# Patient Record
Sex: Female | Born: 1940 | Race: White | Hispanic: No | Marital: Married | Smoking: Never smoker
Health system: Southern US, Community
[De-identification: ages and names within clinical notes are randomized; demographics above are authoritative.]

## PROBLEM LIST (undated history)

## (undated) HISTORY — PX: ABDOMINAL HYSTERECTOMY: SHX81

---

## 2014-01-08 ENCOUNTER — Ambulatory Visit: Payer: Self-pay | Admitting: Internal Medicine

## 2015-08-22 ENCOUNTER — Telehealth: Payer: Self-pay

## 2015-08-22 NOTE — Telephone Encounter (Signed)
When I called Keiyana's husband, Lyda JesterCurtis, the second time I was going to talk to her as well about her exam's. But there was no answer and no answer machine.

## 2017-04-28 ENCOUNTER — Ambulatory Visit
Admission: EM | Admit: 2017-04-28 | Discharge: 2017-04-28 | Disposition: A | Discharge status: Other Acute Inpatient Hospital | Payer: Medicare Other | Attending: Family Medicine | Admitting: Family Medicine

## 2017-04-28 ENCOUNTER — Encounter: Payer: Self-pay | Admitting: *Deleted

## 2017-04-28 DIAGNOSIS — R600 Localized edema: Secondary | ICD-10-CM | POA: Insufficient documentation

## 2017-04-28 DIAGNOSIS — R2242 Localized swelling, mass and lump, left lower limb: Secondary | ICD-10-CM | POA: Diagnosis not present

## 2017-04-28 DIAGNOSIS — I38 Endocarditis, valve unspecified: Secondary | ICD-10-CM | POA: Diagnosis not present

## 2017-04-28 DIAGNOSIS — I5021 Acute systolic (congestive) heart failure: Secondary | ICD-10-CM | POA: Diagnosis not present

## 2017-04-28 DIAGNOSIS — R0602 Shortness of breath: Secondary | ICD-10-CM | POA: Insufficient documentation

## 2017-04-28 DIAGNOSIS — R2241 Localized swelling, mass and lump, right lower limb: Secondary | ICD-10-CM

## 2017-04-28 DIAGNOSIS — R9431 Abnormal electrocardiogram [ECG] [EKG]: Secondary | ICD-10-CM

## 2017-04-28 DIAGNOSIS — R140 Abdominal distension (gaseous): Secondary | ICD-10-CM | POA: Insufficient documentation

## 2017-04-28 NOTE — ED Provider Notes (Signed)
[facility]    CSN: [id number] Arrival date & time: 04/28/17  1435     History   Chief Complaint Chief Complaint  Patient presents with  . Leg Swelling  . Shortness of Breath    HPI Toni Allen is a 76 y.o. female.   Patient is a 76 year old white female brought to the EDAccording to son-in-law and patient fully agrees initially to let her son-in-law and husband talk for her she has been having swelling of her legs for 6 weeks. According to them the shortness of breath is progressively gotten worse as well. Patient expresses concerned about the shortness of breath that she is having but she attributes that to the fact that her legs are markedly swollen and that because of her inability to move her swollen legs that is causing her shortness of breath. According to her husband she has not been to see a doctor for probably 20-25 years. He called last week to try to get her into see her PCP but because his been so long since she's been seen she was being treated as needed patient and the earliest appointment was made July. According to her son-in-law he's been trying to get her into see a doctor now for about 6 weeks. They report no medical problems for her she's been healthy no drug allergies is on no medication and she does not smoke and no one smokes around her. No known drug allergies she is never smoked. No pertinent family medical history relevant to today's visit. She does report increased shortness of breath when she exerts herself   The history is provided by the patient, the spouse and a relative. No language interpreter was used.  Shortness of Breath  Severity:  Severe Onset quality:  Gradual Progression:  Worsening Chronicity:  New Context: activity   Relieved by:  Nothing Worsened by:  Exertion Ineffective treatments:  None tried Associated symptoms: no chest pain   Risk factors: no tobacco use     History reviewed. No pertinent past medical  history.  There are no active problems to display for this patient.   History reviewed. No pertinent surgical history.  OB History    No data available       Home Medications    Prior to Admission medications   Not on File    Family History History reviewed. No pertinent family history.  Social History Social History  Substance Use Topics  . Smoking status: Never Smoker  . Smokeless tobacco: Never Used  . Alcohol use No     Allergies   Patient has no known allergies.   Review of Systems Review of Systems  Respiratory: Positive for shortness of breath.   Cardiovascular: Positive for leg swelling. Negative for chest pain.     Physical Exam Triage Vital Signs ED Triage Vitals  Enc Vitals Group     BP 04/28/17 1447 (!) 180/75     Pulse Rate 04/28/17 1447 92     Resp 04/28/17 1447 20     Temp 04/28/17 1447 98 F (36.7 C)     Temp Source 04/28/17 1447 Oral     SpO2 04/28/17 1447 100 %     Weight 04/28/17 1448 165 lb (74.8 kg)     Height 04/28/17 1448 5\' 5"  (1.651 m)     Head Circumference --      Peak Flow --      Pain Score 04/28/17 1448 0     Pain Loc --  Pain Edu? --      Excl. in GC? --    No data found.   Updated Vital Signs BP (!) 180/75 (BP Location: Right Arm)   Pulse 92   Temp 98 F (36.7 C) (Oral)   Resp 20   Ht 5\' 5"  (1.651 m)   Wt 165 lb (74.8 kg)   SpO2 100%   BMI 27.46 kg/m   Visual Acuity Right Eye Distance:   Left Eye Distance:   Bilateral Distance:    Right Eye Near:   Left Eye Near:    Bilateral Near:     Physical Exam  Constitutional: She appears well-developed and well-nourished. She appears ill.  HENT:  Head: Normocephalic and atraumatic.  Right Ear: Hearing, tympanic membrane, external ear and ear canal normal.  Left Ear: Hearing, tympanic membrane, external ear and ear canal normal.  Nose: Nose normal.  Eyes: Pupils are equal, round, and reactive to light.  Neck: Normal range of motion. Neck supple.  Normal carotid pulses and no hepatojugular reflux present. Carotid bruit is not present.  Cardiovascular:  Murmur heard.  Systolic murmur is present with a grade of 5/6  Patient has a hoarse a 5/6 holosystolic murmur makes one suspicious of valvular heart disease as well  Pulmonary/Chest: Effort normal. She has decreased breath sounds.  Abdominal: Soft. She exhibits no distension. There is no tenderness.  Musculoskeletal: Normal range of motion. She exhibits no edema.  Neurological: She is alert.  Skin: Skin is warm.  Psychiatric: Her mood appears anxious. Her affect is inappropriate. She expresses inappropriate judgment.  Patient is very upset at times appropriate with her refusal to go to the hospital. Initially was felt that she did not want to go to [facility] but now she informs her son-in-law and husband she wants to go to know hospital. Her son-in-law is going to call her son to come get her. She is also seemed to be very obsessed with her Bible having it daily but lotion on and that she is going to be mistreated in the hospital  Vitals reviewed.    UC Treatments / Results  Labs (all labs ordered are listed, but only abnormal results are displayed) Labs Reviewed - No data to display  EKG  EKG Interpretation None     ED ECG REPORT I, Blessing Ozga H, the attending physician, personally viewed and interpreted this ECG.   Date: 04/28/2017  EKG Time: 14:58:28  Rate: 94  Rhythm: there are no previous tracings available for comparison, normal sinus rhythm, Left atrial enlargement and T waves abnormalities may have anterior infarct with the small QRS complexes Q waves in 3  Axis:48  Intervals:none  ST&T Change: Nonspecific     Radiology No results found.  Procedures Procedures (including critical care time)  Medications Ordered in UC Medications - No data to display   Initial Impression / Assessment and Plan / UC Course  I have reviewed the triage vital signs and the  nursing notes.  Pertinent labs & imaging results that were available during my care of the patient were reviewed by me and considered in my medical decision making (see chart for details).   strongly feel the patient is in CHF. I'm going to recommend hospital referral. Patient was upset son-in-law finally calmed down only afford become upset again when she found out she was going to the hospital even going to [facility]. They're going to call or contact her son who works for EMS and [employer] to see  if he can talk her into coming going to the hospital. This point time she is yelling screaming and we'll try to aggravate her or agitated anymore. [facility]   Final Clinical Impressions(s) / UC Diagnoses   Final diagnoses:  Acute systolic congestive heart failure (HCC)  Valvular heart disease  Nonspecific abnormal electrocardiogram (ECG) (EKG)    New Prescriptions There are no discharge medications for this patient.   Note: This dictation was prepared with Dragon dictation along with smaller phrase technology. Any transcriptional errors that result from this process are unintentional.   Hassan RowanWade, Askari Kinley, MD 04/28/17 [phone]

## 2017-04-28 NOTE — ED Triage Notes (Signed)
Patient started having symptom of bilateral leg swelling that started 6 weeks ago. Lower legs are visible swollen. Symptom of SOB started more recently.

## 2017-05-12 ENCOUNTER — Ambulatory Visit (INDEPENDENT_AMBULATORY_CARE_PROVIDER_SITE_OTHER): Payer: Medicare Other | Admitting: Unknown Physician Specialty

## 2017-05-12 ENCOUNTER — Encounter: Payer: Self-pay | Admitting: Unknown Physician Specialty

## 2017-05-12 VITALS — BP 238/80 | HR 80 | Temp 98.5°F | Wt 126.4 lb

## 2017-05-12 DIAGNOSIS — Z7689 Persons encountering health services in other specified circumstances: Secondary | ICD-10-CM

## 2017-05-12 DIAGNOSIS — R748 Abnormal levels of other serum enzymes: Secondary | ICD-10-CM | POA: Insufficient documentation

## 2017-05-12 DIAGNOSIS — R140 Abdominal distension (gaseous): Secondary | ICD-10-CM | POA: Diagnosis not present

## 2017-05-12 DIAGNOSIS — R600 Localized edema: Secondary | ICD-10-CM

## 2017-05-12 DIAGNOSIS — R269 Unspecified abnormalities of gait and mobility: Secondary | ICD-10-CM | POA: Diagnosis not present

## 2017-05-12 DIAGNOSIS — R32 Unspecified urinary incontinence: Secondary | ICD-10-CM

## 2017-05-12 LAB — UA/M W/RFLX CULTURE, ROUTINE
BILIRUBIN UA: NEGATIVE
Glucose, UA: NEGATIVE
Leukocytes, UA: NEGATIVE
NITRITE UA: NEGATIVE
PH UA: 7 (ref 5.0–7.5)
Specific Gravity, UA: 1.015 (ref 1.005–1.030)
UUROB: 0.2 mg/dL (ref 0.2–1.0)

## 2017-05-12 LAB — MICROSCOPIC EXAMINATION
RBC MICROSCOPIC, UA: NONE SEEN /HPF (ref 0–?)
WBC UA: NONE SEEN /HPF (ref 0–?)

## 2017-05-12 NOTE — Assessment & Plan Note (Signed)
Discussed increased walking and compression stocking as needed.

## 2017-05-12 NOTE — Assessment & Plan Note (Signed)
Will need a GGT and fractinaed alk phos

## 2017-05-12 NOTE — Assessment & Plan Note (Addendum)
I'd like to refer to orthopedics or neurology.  Pt is upset with any mentions of medical appointments and husband asked me to "hold off" and any additional appointments at this time.  Refusing PT at this time

## 2017-05-12 NOTE — Assessment & Plan Note (Signed)
Distention is improved

## 2017-05-12 NOTE — Progress Notes (Signed)
BP (!) 238/80   Pulse 80   Temp 98.5 F (36.9 C)   Wt 126 lb 6.4 oz (57.3 kg)   SpO2 98%   BMI 21.03 kg/m    Subjective:    Patient ID: Toni Allen, female    DOB: 10/15/1941, 76 y.o.   MRN: [id number]  HPI: [name] is a 76 y.o. female  Chief Complaint  Patient presents with  . Establish Care    pt's husband states that the patient has been having a little bit of incontenience lately and would also like discuss her legs. States that the patient's legs and ankles have been swelling, states the patient needs compression socks    Pt is here with her husband who give part of the hisotry  Review of notes from [facility] urgent care and [facility] ER: Pt presented to [facility] urgent care 6/25 for complaints of progressive SOB and leg swelling.  She was then sent to [facility] ER for possible admission.  It was determined then she was not in CHF and likely had venous stasis as the cause for leg swelling.  Abdominal distention thought likely to constipation.    CTA was normal, EKG was normal, Chest x-ray was normal.  Labs were within normal limits but I do note an Alk phos of 247.    Her primary concern is her legs are swelling for about 2 months.  She brought some compression stockings OTC which help.  She states she stockings itch.   Fall Risk  05/12/2017  Falls in the past year? No   Depression screen PHQ 2/9 05/12/2017  Decreased Interest 0  Down, Depressed, Hopeless 0  PHQ - 2 Score 0  Altered sleeping 0  Tired, decreased energy 1  Change in appetite 0  Feeling bad or failure about yourself  0  Trouble concentrating 0  Moving slowly or fidgety/restless 0  Suicidal thoughts 0  PHQ-9 Score 1    Incontinence  Increased incontinence following hospitalization for several days.  This has improved but having urinary frequency.    Gait There is a problem with walking.  Husband states she used to have a normal gait until about 2 months ago.  No pain except with swelling.  She now walks  with difficulty  Relevant past medical, surgical, family and social history reviewed and updated as indicated. Interim medical history since our last visit reviewed. Allergies and medications reviewed and updated.  Review of Systems  Constitutional: Negative.   HENT: Negative.   Eyes: Negative.   Respiratory: Negative.   Cardiovascular: Positive for leg swelling.  Gastrointestinal: Positive for constipation.  Endocrine: Negative.   Genitourinary: Negative.   Musculoskeletal: Positive for gait problem.  Skin: Negative.   Allergic/Immunologic: Negative.   Hematological: Negative.   Psychiatric/Behavioral: Negative.     Per HPI unless specifically indicated above     Objective:    BP (!) 238/80   Pulse 80   Temp 98.5 F (36.9 C)   Wt 126 lb 6.4 oz (57.3 kg)   SpO2 98%   BMI 21.03 kg/m   Wt Readings from Last 3 Encounters:  05/12/17 126 lb 6.4 oz (57.3 kg)  04/28/17 165 lb (74.8 kg)    Note weight on 6/25 is an error Physical Exam  Constitutional: She is oriented to person, place, and time. She appears well-developed and well-nourished. No distress.  HENT:  Head: Normocephalic and atraumatic.  Eyes: Conjunctivae and lids are normal. Right eye exhibits no discharge. Left eye exhibits  no discharge. No scleral icterus.  Neck: Normal range of motion. Neck supple. No JVD present. Carotid bruit is not present.  Cardiovascular: Normal rate, regular rhythm and normal heart sounds.   Pulmonary/Chest: Effort normal and breath sounds normal.  Abdominal: Normal appearance. There is no splenomegaly or hepatomegaly.  Musculoskeletal: Normal range of motion.  Neurological: She is alert and oriented to person, place, and time.  Skin: Skin is warm, dry and intact. No rash noted. No pallor.  Psychiatric: She has a normal mood and affect. Her behavior is normal. Judgment and thought content normal.    Results for orders placed or performed in visit on 05/12/17  Microscopic Examination   Result Value Ref Range   WBC, UA None seen 0 - 5 /hpf   RBC, UA None seen 0 - 2 /hpf   Epithelial Cells (non renal) 0-10 0 - 10 /hpf   Mucus, UA Present (A) Not Estab.   Bacteria, UA Few (A) None seen/Few  UA/M w/rflx Culture, Routine  Result Value Ref Range   Specific Gravity, UA 1.015 1.005 - 1.030   pH, UA 7.0 5.0 - 7.5   Color, UA Yellow Yellow   Appearance Ur Clear Clear   Leukocytes, UA Negative Negative   Protein, UA Trace (A) Negative/Trace   Glucose, UA Negative Negative   Ketones, UA 2+ (A) Negative   RBC, UA Trace (A) Negative   Bilirubin, UA Negative Negative   Urobilinogen, Ur 0.2 0.2 - 1.0 mg/dL   Nitrite, UA Negative Negative   Microscopic Examination See below:       Assessment & Plan:   Problem List Items Addressed This Visit      Unprioritized   Abdominal distension    Distention is improved      Bilateral lower extremity edema    Discussed increased walking and compression stocking as needed.        Elevated alkaline phosphatase level    Will need a GGT and fractinaed alk phos      Relevant Orders   Gamma GT   Alkaline phosphatase   Gait abnormality    I'd like to refer to orthopedics or neurology.  Pt is upset with any mentions of medical appointments and husband asked me to "hold off" and any additional appointments at this time.  Refusing PT at this time       Other Visit Diagnoses    Urinary incontinence, unspecified type    -  Primary   Relevant Orders   UA/M w/rflx Culture, Routine (Completed)   Encounter to establish care           Follow up plan: Return in about 3 months (around 08/12/2017).

## 2017-05-13 LAB — ALKALINE PHOSPHATASE: ALK PHOS: 259 IU/L — AB (ref 39–117)

## 2017-05-13 LAB — GAMMA GT: GGT: 10 IU/L (ref 0–60)

## 2017-05-19 ENCOUNTER — Other Ambulatory Visit: Payer: Self-pay | Admitting: Unknown Physician Specialty

## 2017-05-19 ENCOUNTER — Telehealth: Payer: Self-pay | Admitting: Unknown Physician Specialty

## 2017-05-19 DIAGNOSIS — R748 Abnormal levels of other serum enzymes: Secondary | ICD-10-CM

## 2017-05-19 DIAGNOSIS — R824 Acetonuria: Secondary | ICD-10-CM | POA: Insufficient documentation

## 2017-05-19 NOTE — Telephone Encounter (Signed)
Discussed with husband to return for additional labs

## 2017-06-23 ENCOUNTER — Telehealth: Payer: Self-pay | Admitting: Unknown Physician Specialty

## 2017-06-23 NOTE — Telephone Encounter (Signed)
Discussed with husband about pt.  She will follow up September 9  and we will recheck her Alk phos.  Her ambulation is better.

## 2017-08-12 ENCOUNTER — Ambulatory Visit
Admission: RE | Admit: 2017-08-12 | Discharge: 2017-08-12 | Disposition: A | Discharge status: Home / Self Care | Payer: Medicare Other | Source: Ambulatory Visit | Attending: Unknown Physician Specialty | Admitting: Unknown Physician Specialty

## 2017-08-12 ENCOUNTER — Other Ambulatory Visit: Admission: RE | Admit: 2017-08-12 | Payer: Medicare Other | Source: Ambulatory Visit

## 2017-08-12 ENCOUNTER — Ambulatory Visit (INDEPENDENT_AMBULATORY_CARE_PROVIDER_SITE_OTHER): Payer: Medicare Other | Admitting: Unknown Physician Specialty

## 2017-08-12 ENCOUNTER — Encounter: Payer: Self-pay | Admitting: Unknown Physician Specialty

## 2017-08-12 ENCOUNTER — Other Ambulatory Visit: Payer: Self-pay | Admitting: Unknown Physician Specialty

## 2017-08-12 VITALS — BP 153/95 | HR 75 | Temp 98.4°F | Wt 137.8 lb

## 2017-08-12 DIAGNOSIS — R918 Other nonspecific abnormal finding of lung field: Secondary | ICD-10-CM | POA: Diagnosis not present

## 2017-08-12 DIAGNOSIS — R600 Localized edema: Secondary | ICD-10-CM | POA: Insufficient documentation

## 2017-08-12 DIAGNOSIS — R269 Unspecified abnormalities of gait and mobility: Secondary | ICD-10-CM | POA: Diagnosis not present

## 2017-08-12 DIAGNOSIS — S2241XA Multiple fractures of ribs, right side, initial encounter for closed fracture: Secondary | ICD-10-CM | POA: Insufficient documentation

## 2017-08-12 DIAGNOSIS — X58XXXA Exposure to other specified factors, initial encounter: Secondary | ICD-10-CM | POA: Insufficient documentation

## 2017-08-12 DIAGNOSIS — R5383 Other fatigue: Secondary | ICD-10-CM

## 2017-08-12 DIAGNOSIS — R824 Acetonuria: Secondary | ICD-10-CM

## 2017-08-12 DIAGNOSIS — R748 Abnormal levels of other serum enzymes: Secondary | ICD-10-CM | POA: Diagnosis not present

## 2017-08-12 LAB — UA/M W/RFLX CULTURE, ROUTINE
Bilirubin, UA: NEGATIVE
Glucose, UA: NEGATIVE
Ketones, UA: NEGATIVE
LEUKOCYTES UA: NEGATIVE
Nitrite, UA: NEGATIVE
PH UA: 5.5 (ref 5.0–7.5)
PROTEIN UA: NEGATIVE
Specific Gravity, UA: <1.005 — ABNORMAL LOW (ref 1.005–1.030)
Urobilinogen, Ur: 0.2 mg/dL (ref 0.2–1.0)

## 2017-08-12 MED ORDER — FUROSEMIDE 20 MG PO TABS: 20.0000 mg | ORAL_TABLET | Freq: Every day | ORAL | 3 refills | Status: DC

## 2017-08-12 MED ORDER — POTASSIUM CHLORIDE ER 10 MEQ PO TBCR: 10.0000 meq | EXTENDED_RELEASE_TABLET | Freq: Every day | ORAL | 1 refills | Status: DC

## 2017-08-12 NOTE — Assessment & Plan Note (Signed)
Discussion with patient and husband that liver source ruled out.  Will check bone source

## 2017-08-12 NOTE — Progress Notes (Signed)
BP (!) 153/95   Pulse 75   Temp 98.4 F (36.9 C)   Wt 137 lb 12.8 oz (62.5 kg)   BMI 22.93 kg/m    Subjective:    Patient ID: Toni Allen, female    DOB: 1940-11-24, 76 y.o.   MRN: [id number]  HPI: Toni Allen is a 76 y.o. female  Chief Complaint  Patient presents with  . Follow-up    3 month f/up   Pt comes in with husband who gives part of the history and she has noted is responsible for all her care. Pt it lost to f/u and  has a very high alk phos level noted on last visit.  She has been convinced to be seen due to persistent leg swelling.  Pt states it doesn't matter what she does, her legs stay swollen.  Using compression stockings help.  Also notes Ketosis from last visit.  She has gained 8 pounds from previous.  Struggling with ambulation but states it is due to le swelling.  Having fatigue in that she falls asleep easily.  No SOB.      Relevant past medical, surgical, family and social history reviewed and updated as indicated. Interim medical history since our last visit reviewed. Allergies and medications reviewed and updated.  Review of Systems  Per HPI unless specifically indicated above     Objective:    BP (!) 153/95   Pulse 75   Temp 98.4 F (36.9 C)   Wt 137 lb 12.8 oz (62.5 kg)   BMI 22.93 kg/m   Wt Readings from Last 3 Encounters:  08/12/17 137 lb 12.8 oz (62.5 kg)  05/12/17 126 lb 6.4 oz (57.3 kg)  04/28/17 165 lb (74.8 kg)    Physical Exam  Constitutional: She is oriented to person, place, and time. She appears well-developed and well-nourished. No distress.  HENT:  Head: Normocephalic and atraumatic.  Eyes: Conjunctivae and lids are normal. Right eye exhibits no discharge. Left eye exhibits no discharge. No scleral icterus.  Neck: Normal range of motion. Neck supple. No JVD present. Carotid bruit is not present.  Cardiovascular: Normal rate, regular rhythm and normal heart sounds.   Pulmonary/Chest: Effort normal and breath sounds  normal.  Abdominal: Normal appearance. There is no splenomegaly or hepatomegaly.  Musculoskeletal:  Bilateral leg edema  Neurological: She is alert and oriented to person, place, and time.  Skin: Skin is warm, dry and intact. No rash noted. No pallor.  Psychiatric: She has a normal mood and affect. Her behavior is normal. Judgment and thought content normal.     Assessment & Plan:   Problem List Items Addressed This Visit      Unprioritized   Bilateral lower extremity edema - Primary   Relevant Medications   furosemide (LASIX) 20 MG tablet   Other Relevant Orders   DG Chest 2 View   DG HIPS BILAT WITH PELVIS 3-4 VIEWS   Comprehensive metabolic panel   Elevated alkaline phosphatase level    Discussion with patient and husband that liver source ruled out.  Will check bone source      Relevant Orders   Comprehensive metabolic panel   Gait abnormality    Suspect related to her elevated Alk phos.  Check pelvis x-ray      Urine ketones    Recheck urine today       Other Visit Diagnoses    Other fatigue       Relevant Orders   CBC with Differential/Platelet  TSH       Follow up plan: Return in about 2 weeks (around 08/26/2017).

## 2017-08-12 NOTE — Assessment & Plan Note (Signed)
Suspect related to her elevated Alk phos.  Check pelvis x-ray

## 2017-08-12 NOTE — Assessment & Plan Note (Signed)
Recheck urine today  

## 2017-08-13 ENCOUNTER — Telehealth: Payer: Self-pay | Admitting: Unknown Physician Specialty

## 2017-08-13 ENCOUNTER — Other Ambulatory Visit: Payer: Self-pay | Admitting: Unknown Physician Specialty

## 2017-08-13 DIAGNOSIS — S2241XA Multiple fractures of ribs, right side, initial encounter for closed fracture: Secondary | ICD-10-CM

## 2017-08-13 DIAGNOSIS — R7989 Other specified abnormal findings of blood chemistry: Secondary | ICD-10-CM

## 2017-08-13 DIAGNOSIS — S2239XA Fracture of one rib, unspecified side, initial encounter for closed fracture: Secondary | ICD-10-CM | POA: Insufficient documentation

## 2017-08-13 DIAGNOSIS — E2839 Other primary ovarian failure: Secondary | ICD-10-CM

## 2017-08-13 DIAGNOSIS — E559 Vitamin D deficiency, unspecified: Secondary | ICD-10-CM | POA: Insufficient documentation

## 2017-08-13 MED ORDER — VITAMIN D (ERGOCALCIFEROL) 1.25 MG (50000 UNIT) PO CAPS: 50000.0000 [IU] | ORAL_CAPSULE | ORAL | 0 refills | Status: DC

## 2017-08-13 NOTE — Telephone Encounter (Signed)
Discussed rib fractures with husband.  He has no recollection of any traumatic events.  Pt not complaining of pain.  Very high PTH.  Normal Calcium.   Discussed with Dr. Dossie Arbour. Will refer to Endocrinologist

## 2017-08-13 NOTE — Progress Notes (Signed)
Scheduled for Tuesday November 6th at 10:00 AM in [place] per patient preference. Husband notified as he is in the office.

## 2017-08-14 LAB — PTH, INTACT AND CALCIUM: PTH: 296 pg/mL — AB (ref 15–65)

## 2017-08-14 LAB — CBC WITH DIFFERENTIAL/PLATELET
BASOS ABS: 0 10*3/uL (ref 0.0–0.2)
Basos: 1 %
EOS (ABSOLUTE): 0.3 10*3/uL (ref 0.0–0.4)
Eos: 6 %
Hematocrit: 39.8 % (ref 34.0–46.6)
Hemoglobin: 13.5 g/dL (ref 11.1–15.9)
IMMATURE GRANULOCYTES: 0 %
Immature Grans (Abs): 0 10*3/uL (ref 0.0–0.1)
LYMPHS ABS: 1.5 10*3/uL (ref 0.7–3.1)
Lymphs: 27 %
MCH: 31.2 pg (ref 26.6–33.0)
MCHC: 33.9 g/dL (ref 31.5–35.7)
MCV: 92 fL (ref 79–97)
MONOCYTES: 7 %
MONOS ABS: 0.4 10*3/uL (ref 0.1–0.9)
NEUTROS ABS: 3.3 10*3/uL (ref 1.4–7.0)
Neutrophils: 59 %
PLATELETS: 225 10*3/uL (ref 150–379)
RBC: 4.33 x10E6/uL (ref 3.77–5.28)
RDW: 14.7 % (ref 12.3–15.4)
WBC: 5.6 10*3/uL (ref 3.4–10.8)

## 2017-08-14 LAB — COMPREHENSIVE METABOLIC PANEL
A/G RATIO: 1.8 (ref 1.2–2.2)
ALBUMIN: 4.3 g/dL (ref 3.5–4.8)
ALT: 14 IU/L (ref 0–32)
AST: 14 IU/L (ref 0–40)
Alkaline Phosphatase: 209 IU/L — ABNORMAL HIGH (ref 39–117)
BILIRUBIN TOTAL: 0.3 mg/dL (ref 0.0–1.2)
BUN / CREAT RATIO: 16 (ref 12–28)
BUN: 11 mg/dL (ref 8–27)
CHLORIDE: 108 mmol/L — AB (ref 96–106)
CO2: 19 mmol/L — ABNORMAL LOW (ref 20–29)
Calcium: 8.7 mg/dL (ref 8.7–10.3)
Creatinine, Ser: 0.68 mg/dL (ref 0.57–1.00)
GFR calc non Af Amer: 84 mL/min/{1.73_m2} (ref >59)
GFR, EST AFRICAN AMERICAN: 97 mL/min/{1.73_m2} (ref >59)
GLOBULIN, TOTAL: 2.4 g/dL (ref 1.5–4.5)
Glucose: 75 mg/dL (ref 65–99)
POTASSIUM: 4.4 mmol/L (ref 3.5–5.2)
SODIUM: 141 mmol/L (ref 134–144)
TOTAL PROTEIN: 6.7 g/dL (ref 6.0–8.5)

## 2017-08-14 LAB — AMYLASE: AMYLASE: 98 U/L (ref 31–124)

## 2017-08-14 LAB — ALKALINE PHOSPHATASE, ISOENZYMES
BONE FRACTION: 62 % (ref 14–68)
INTESTINAL FRAC.: 4 % (ref 0–18)
LIVER FRACTION: 34 % (ref 18–85)

## 2017-08-14 LAB — LIPASE: LIPASE: 42 U/L (ref 14–85)

## 2017-08-14 LAB — TSH: TSH: 7.02 u[IU]/mL — ABNORMAL HIGH (ref 0.450–4.500)

## 2017-08-14 LAB — VITAMIN D 25 HYDROXY (VIT D DEFICIENCY, FRACTURES): Vit D, 25-Hydroxy: <4 ng/mL — ABNORMAL LOW (ref 30.0–100.0)

## 2017-08-18 ENCOUNTER — Other Ambulatory Visit: Payer: Self-pay | Admitting: Unknown Physician Specialty

## 2017-08-18 DIAGNOSIS — S2241XA Multiple fractures of ribs, right side, initial encounter for closed fracture: Secondary | ICD-10-CM

## 2017-08-18 NOTE — Progress Notes (Signed)
Called and spoke to patient's husband. He states that he is not really concerned with this right now. He states that he is afraid that if we put to much on the patient right now that she will not participate. He states that they have a bone density scan schedule for 09/09/17 and that they are supposed to be going to Endocrinology as well. Patient's husband wants to postpone going to orthopedics for right now.

## 2017-08-18 NOTE — Progress Notes (Signed)
Tried calling patient/husband and did not get an answer. No VM came up either so I will try to call again later.

## 2017-08-21 ENCOUNTER — Telehealth: Payer: Self-pay | Admitting: Family Medicine

## 2017-08-21 NOTE — Telephone Encounter (Signed)
Labs faxed

## 2017-08-21 NOTE — Telephone Encounter (Signed)
Will forward to referral coordinator. Also, please check PCP. Appears to be a [facility].

## 2017-08-21 NOTE — Telephone Encounter (Signed)
[facility] requesting lab results (PTH and vitamin D) as well as metabolic panel for pt's apt.   Please Advise.  Thank you  Phone: [phone] Fax: [phone]

## 2017-08-29 ENCOUNTER — Encounter: Payer: Self-pay | Admitting: Family Medicine

## 2017-09-03 ENCOUNTER — Ambulatory Visit (INDEPENDENT_AMBULATORY_CARE_PROVIDER_SITE_OTHER): Payer: Medicare Other | Admitting: Unknown Physician Specialty

## 2017-09-03 ENCOUNTER — Encounter: Payer: Self-pay | Admitting: Unknown Physician Specialty

## 2017-09-03 VITALS — BP 161/88 | HR 75 | Temp 98.3°F | Wt 137.8 lb

## 2017-09-03 DIAGNOSIS — Z5181 Encounter for therapeutic drug level monitoring: Secondary | ICD-10-CM

## 2017-09-03 DIAGNOSIS — E559 Vitamin D deficiency, unspecified: Secondary | ICD-10-CM | POA: Diagnosis not present

## 2017-09-03 DIAGNOSIS — R748 Abnormal levels of other serum enzymes: Secondary | ICD-10-CM

## 2017-09-03 DIAGNOSIS — R600 Localized edema: Secondary | ICD-10-CM

## 2017-09-03 DIAGNOSIS — R7989 Other specified abnormal findings of blood chemistry: Secondary | ICD-10-CM

## 2017-09-03 DIAGNOSIS — S2241XA Multiple fractures of ribs, right side, initial encounter for closed fracture: Secondary | ICD-10-CM | POA: Diagnosis not present

## 2017-09-03 MED ORDER — FUROSEMIDE 20 MG PO TABS: 20.0000 mg | ORAL_TABLET | Freq: Every day | ORAL | 3 refills | Status: DC

## 2017-09-03 MED ORDER — POTASSIUM CHLORIDE ER 10 MEQ PO TBCR: 10.0000 meq | EXTENDED_RELEASE_TABLET | Freq: Every day | ORAL | 2 refills | Status: DC

## 2017-09-03 NOTE — Assessment & Plan Note (Signed)
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  Refusing bone scan.  Urgent referral needed to Endocrine

## 2017-09-03 NOTE — Assessment & Plan Note (Signed)
Severe.  Urgent need for Endocrinology

## 2017-09-03 NOTE — Assessment & Plan Note (Signed)
Improved with current medication.  Refusing additional scans

## 2017-09-03 NOTE — Assessment & Plan Note (Addendum)
Non-traumatic.  Refusing bone scan or Orthopedic referral

## 2017-09-03 NOTE — Progress Notes (Signed)
BP (!) 161/88 (BP Location: Left Arm, Cuff Size: Small)   Pulse 75   Temp 98.3 F (36.8 C)   Wt 137 lb 12.8 oz (62.5 kg)   SpO2 99%   BMI 22.93 kg/m    Subjective:    Patient ID: Toni Allen, female    DOB: 10-23-1941, 76 y.o.   MRN: [id number]  HPI: Toni Allen is a 76 y.o. female  Chief Complaint  Patient presents with  . Follow-up   Husband is here with pt who gives much of the feedback and the history.  Pt has many concerning issues that include extreme elevation of PTH, Alk phos, very low Vit  D, and non-traumatic rib fractures.  Refusing Vitamin D as she is refusing as she feels her Vitamin C is adequate.   Urgent need to see Endocrine.  On communication with Heme/onc. Bone scan suggested but refused by the pt at this time.    Leg swelling Responding well to Furosemide and potassium.    Hypertension BP is high here but checks religiously at home.  Typically 120's over 60's    Relevant past medical, surgical, family and social history reviewed and updated as indicated. Interim medical history since our last visit reviewed. Allergies and medications reviewed and updated.  Review of Systems  Per HPI unless specifically indicated above     Objective:    BP (!) 161/88 (BP Location: Left Arm, Cuff Size: Small)   Pulse 75   Temp 98.3 F (36.8 C)   Wt 137 lb 12.8 oz (62.5 kg)   SpO2 99%   BMI 22.93 kg/m   Wt Readings from Last 3 Encounters:  09/03/17 137 lb 12.8 oz (62.5 kg)  08/12/17 137 lb 12.8 oz (62.5 kg)  05/12/17 126 lb 6.4 oz (57.3 kg)    Physical Exam  Constitutional: She is oriented to person, place, and time. She appears well-developed and well-nourished. No distress.  HENT:  Head: Normocephalic and atraumatic.  Eyes: Conjunctivae and lids are normal. Right eye exhibits no discharge. Left eye exhibits no discharge. No scleral icterus.  Neck: Normal range of motion. Neck supple. No JVD present. Carotid bruit is not present.    Cardiovascular: Normal rate, regular rhythm and normal heart sounds.   Pulmonary/Chest: Effort normal and breath sounds normal.  Abdominal: Normal appearance. There is no splenomegaly or hepatomegaly.  Musculoskeletal: Normal range of motion.  Neurological: She is alert and oriented to person, place, and time.  Skin: Skin is warm, dry and intact. No rash noted. No pallor.  Psychiatric: She has a normal mood and affect. Her behavior is normal. Judgment and thought content normal.    Results for orders placed or performed in visit on 08/12/17  Alkaline Phosphatase, Isoenzymes  Result Value Ref Range   LIVER FRACTION 34 18 - 85 %   BONE FRACTION 62 14 - 68 %   INTESTINAL FRAC. 4 0 - 18 %  Amylase  Result Value Ref Range   Amylase 98 31 - 124 U/L  Lipase  Result Value Ref Range   Lipase 42 14 - 85 U/L  UA/M w/rflx Culture, Routine  Result Value Ref Range   Specific Gravity, UA <1.005 (L) 1.005 - 1.030   pH, UA 5.5 5.0 - 7.5   Color, UA Yellow Yellow   Appearance Ur Clear Clear   Leukocytes, UA Negative Negative   Protein, UA Negative Negative/Trace   Glucose, UA Negative Negative   Ketones, UA Negative Negative   RBC,  UA Trace (A) Negative   Bilirubin, UA Negative Negative   Urobilinogen, Ur 0.2 0.2 - 1.0 mg/dL   Nitrite, UA Negative Negative  PTH, Intact and Calcium  Result Value Ref Range   PTH 296 (H) 15 - 65 pg/mL   PTH Interp Comment   Vitamin D (25 hydroxy)  Result Value Ref Range   Vit D, 25-Hydroxy <4.0 (L) 30.0 - 100.0 ng/mL  Comprehensive metabolic panel  Result Value Ref Range   Glucose 75 65 - 99 mg/dL   BUN 11 8 - 27 mg/dL   Creatinine, Ser 0.68 0.57 - 1.00 mg/dL   GFR calc non Af Amer 84 >59 mL/min/1.73   GFR calc Af Amer 97 >59 mL/min/1.73   BUN/Creatinine Ratio 16 12 - 28   Sodium 141 134 - 144 mmol/L   Potassium 4.4 3.5 - 5.2 mmol/L   Chloride 108 (H) 96 - 106 mmol/L   CO2 19 (L) 20 - 29 mmol/L   Calcium 8.7 8.7 - 10.3 mg/dL   Total Protein 6.7  6.0 - 8.5 g/dL   Albumin 4.3 3.5 - 4.8 g/dL   Globulin, Total 2.4 1.5 - 4.5 g/dL   Albumin/Globulin Ratio 1.8 1.2 - 2.2   Bilirubin Total 0.3 0.0 - 1.2 mg/dL   Alkaline Phosphatase 209 (H) 39 - 117 IU/L   AST 14 0 - 40 IU/L   ALT 14 0 - 32 IU/L  CBC with Differential/Platelet  Result Value Ref Range   WBC 5.6 3.4 - 10.8 x10E3/uL   RBC 4.33 3.77 - 5.28 x10E6/uL   Hemoglobin 13.5 11.1 - 15.9 g/dL   Hematocrit 39.8 34.0 - 46.6 %   MCV 92 79 - 97 fL   MCH 31.2 26.6 - 33.0 pg   MCHC 33.9 31.5 - 35.7 g/dL   RDW 14.7 12.3 - 15.4 %   Platelets 225 150 - 379 x10E3/uL   Neutrophils 59 Not Estab. %   Lymphs 27 Not Estab. %   Monocytes 7 Not Estab. %   Eos 6 Not Estab. %   Basos 1 Not Estab. %   Neutrophils Absolute 3.3 1.4 - 7.0 x10E3/uL   Lymphocytes Absolute 1.5 0.7 - 3.1 x10E3/uL   Monocytes Absolute 0.4 0.1 - 0.9 x10E3/uL   EOS (ABSOLUTE) 0.3 0.0 - 0.4 x10E3/uL   Basophils Absolute 0.0 0.0 - 0.2 x10E3/uL   Immature Granulocytes 0 Not Estab. %   Immature Grans (Abs) 0.0 0.0 - 0.1 x10E3/uL  TSH  Result Value Ref Range   TSH 7.020 (H) 0.450 - 4.500 uIU/mL      Assessment & Plan:   Problem List Items Addressed This Visit      Unprioritized   Bilateral lower extremity edema    Improved with current medication.  Refusing additional scans      Relevant Medications   furosemide (LASIX) 20 MG tablet   Elevated alkaline phosphatase level    Fractionation shows from bone.  Refusing bone scan.  Urgent referral needed to Endocrine      Elevated PTHrP level    Severe.  Urgent need for Endocrinology      Rib fracture    Non-traumatic.  Refusing bone scan or Orthopedic referral      Vitamin D deficiency    Severe, refusing Vitamin D supplementation.         Other Visit Diagnoses    Medication monitoring encounter    -  Primary   Relevant Orders   Comprehensive metabolic panel  Appt with Endocrine in December.  Will ask for an earlier visit.  Patient is refusing  additional testing till then.  OK with a CMP.  Risks of missing an underlying malignancy understood.    Follow up plan: Return in about 3 months (around 12/04/2017).

## 2017-09-03 NOTE — Assessment & Plan Note (Signed)
Severe, refusing Vitamin D supplementation.

## 2017-09-04 LAB — COMPREHENSIVE METABOLIC PANEL
A/G RATIO: 1.9 (ref 1.2–2.2)
ALBUMIN: 4.3 g/dL (ref 3.5–4.8)
ALT: 15 IU/L (ref 0–32)
AST: 18 IU/L (ref 0–40)
Alkaline Phosphatase: 204 IU/L — ABNORMAL HIGH (ref 39–117)
BILIRUBIN TOTAL: 0.3 mg/dL (ref 0.0–1.2)
BUN / CREAT RATIO: 15 (ref 12–28)
BUN: 11 mg/dL (ref 8–27)
CHLORIDE: 106 mmol/L (ref 96–106)
CO2: 20 mmol/L (ref 20–29)
Calcium: 8.9 mg/dL (ref 8.7–10.3)
Creatinine, Ser: 0.72 mg/dL (ref 0.57–1.00)
GFR calc Af Amer: 93 mL/min/{1.73_m2} (ref >59)
GFR calc non Af Amer: 80 mL/min/{1.73_m2} (ref >59)
GLOBULIN, TOTAL: 2.3 g/dL (ref 1.5–4.5)
GLUCOSE: 86 mg/dL (ref 65–99)
POTASSIUM: 4.2 mmol/L (ref 3.5–5.2)
SODIUM: 142 mmol/L (ref 134–144)
TOTAL PROTEIN: 6.6 g/dL (ref 6.0–8.5)

## 2017-09-09 ENCOUNTER — Inpatient Hospital Stay: Admission: RE | Admit: 2017-09-09 | Payer: Medicare Other | Source: Ambulatory Visit

## 2017-11-27 DIAGNOSIS — E213 Hyperparathyroidism, unspecified: Secondary | ICD-10-CM | POA: Insufficient documentation

## 2017-12-04 ENCOUNTER — Encounter: Payer: Self-pay | Admitting: Family Medicine

## 2017-12-04 ENCOUNTER — Ambulatory Visit (INDEPENDENT_AMBULATORY_CARE_PROVIDER_SITE_OTHER): Payer: Medicare Other | Admitting: Family Medicine

## 2017-12-04 VITALS — BP 162/93 | HR 70

## 2017-12-04 DIAGNOSIS — E559 Vitamin D deficiency, unspecified: Secondary | ICD-10-CM | POA: Diagnosis not present

## 2017-12-04 DIAGNOSIS — R748 Abnormal levels of other serum enzymes: Secondary | ICD-10-CM | POA: Diagnosis not present

## 2017-12-04 DIAGNOSIS — R600 Localized edema: Secondary | ICD-10-CM | POA: Diagnosis not present

## 2017-12-04 DIAGNOSIS — Z7189 Other specified counseling: Secondary | ICD-10-CM | POA: Insufficient documentation

## 2017-12-04 MED ORDER — FUROSEMIDE 20 MG PO TABS: 20.0000 mg | ORAL_TABLET | Freq: Every day | ORAL | 6 refills | Status: DC

## 2017-12-04 MED ORDER — POTASSIUM CHLORIDE ER 10 MEQ PO TBCR: 10.0000 meq | EXTENDED_RELEASE_TABLET | Freq: Every day | ORAL | 6 refills | Status: DC

## 2017-12-04 NOTE — Assessment & Plan Note (Signed)
We will follow with labs next visit

## 2017-12-04 NOTE — Assessment & Plan Note (Signed)
A voluntary discussion about advance care planning including the explanation and discussion of advance directives was extensively discussed  with the patient.  Explanation about the health care proxy and Living will was reviewed and packet with forms with explanation of how to fill them out was given.   Time spent: 16+ min       Individuals present: pt and husband.

## 2017-12-04 NOTE — Assessment & Plan Note (Signed)
Reviewed and help reinforce patient linked vitamin C absorption with taking her vitamin D.  May be this will help with compliance. Husband reaffirms the patient is taking her vitamin D now.

## 2017-12-04 NOTE — Progress Notes (Addendum)
BP (!) 162/93   Pulse 70   SpO2 98%    Subjective:    Patient ID: Toni Allen, female    DOB: September 21, 1941, 77 y.o.   MRN: [id number]  HPI: Toni Allen is a 77 y.o. female  Chief Complaint  Patient presents with  . Follow-up  Patient follow-up accompanied by her husband who assist primarily with history. Patient is taking her vitamin D now still is focus completely on vitamin C. Patient using a walker without problems 4 lower extremity edema patient doing somewhat better wearing support hose taking Lasix 20 mg along with potassium. Reviewed endocrinology notes.  Relevant past medical, surgical, family and social history reviewed and updated as indicated. Interim medical history since our last visit reviewed. Allergies and medications reviewed and updated.  Review of Systems  Constitutional: Negative.   Respiratory: Negative.   Cardiovascular: Negative.     Per HPI unless specifically indicated above     Objective:    BP (!) 162/93   Pulse 70   SpO2 98%   Wt Readings from Last 3 Encounters:  09/03/17 137 lb 12.8 oz (62.5 kg)  08/12/17 137 lb 12.8 oz (62.5 kg)  05/12/17 126 lb 6.4 oz (57.3 kg)    Physical Exam  Constitutional: She is oriented to person, place, and time. She appears well-developed and well-nourished.  HENT:  Head: Normocephalic and atraumatic.  Eyes: Conjunctivae and EOM are normal.  Neck: Normal range of motion.  Cardiovascular: Normal rate, regular rhythm and normal heart sounds.  Pulmonary/Chest: Effort normal and breath sounds normal.  Musculoskeletal: Normal range of motion.  Neurological: She is alert and oriented to person, place, and time.  Skin: No erythema.  Psychiatric: She has a normal mood and affect. Her behavior is normal. Judgment and thought content normal.    Results for orders placed or performed in visit on 09/03/17  Comprehensive metabolic panel  Result Value Ref Range   Glucose 86 65 - 99 mg/dL   BUN 11 8 - 27  mg/dL   Creatinine, Ser 4.090.72 0.57 - 1.00 mg/dL   GFR calc non Af Amer 80 >59 mL/min/1.73   GFR calc Af Amer 93 >59 mL/min/1.73   BUN/Creatinine Ratio 15 12 - 28   Sodium 142 134 - 144 mmol/L   Potassium 4.2 3.5 - 5.2 mmol/L   Chloride 106 96 - 106 mmol/L   CO2 20 20 - 29 mmol/L   Calcium 8.9 8.7 - 10.3 mg/dL   Total Protein 6.6 6.0 - 8.5 g/dL   Albumin 4.3 3.5 - 4.8 g/dL   Globulin, Total 2.3 1.5 - 4.5 g/dL   Albumin/Globulin Ratio 1.9 1.2 - 2.2   Bilirubin Total 0.3 0.0 - 1.2 mg/dL   Alkaline Phosphatase 204 (H) 39 - 117 IU/L   AST 18 0 - 40 IU/L   ALT 15 0 - 32 IU/L      Assessment & Plan:   Problem List Items Addressed This Visit      Other   Bilateral lower extremity edema    Stable with Lasix and compression      Relevant Medications   furosemide (LASIX) 20 MG tablet   Elevated alkaline phosphatase level    We will follow with labs next visit      Vitamin D deficiency    Reviewed and help reinforce patient linked vitamin C absorption with taking her vitamin D.  May be this will help with compliance. Husband reaffirms the patient is taking her vitamin  D now.      Advanced care planning/counseling discussion - Primary    A voluntary discussion about advance care planning including the explanation and discussion of advance directives was extensively discussed  with the patient.  Explanation about the health care proxy and Living will was reviewed and packet with forms with explanation of how to fill them out was given.   Time spent: 16+ min       Individuals present: pt and husband.           Follow up plan: Return in about 4 months (around 04/03/2018), or if symptoms worsen or fail to improve, for  CMP, TSH.

## 2017-12-04 NOTE — Assessment & Plan Note (Signed)
Stable with Lasix and compression

## 2018-04-01 ENCOUNTER — Ambulatory Visit: Payer: Medicare Other | Admitting: Family Medicine

## 2018-04-01 ENCOUNTER — Ambulatory Visit: Payer: Self-pay

## 2018-04-22 ENCOUNTER — Ambulatory Visit (INDEPENDENT_AMBULATORY_CARE_PROVIDER_SITE_OTHER): Payer: Medicare Other | Admitting: Family Medicine

## 2018-04-22 ENCOUNTER — Encounter: Payer: Self-pay | Admitting: Family Medicine

## 2018-04-22 ENCOUNTER — Ambulatory Visit (INDEPENDENT_AMBULATORY_CARE_PROVIDER_SITE_OTHER): Payer: Medicare Other

## 2018-04-22 VITALS — BP 142/84 | HR 70 | Temp 98.5°F | Resp 16 | Ht 64.0 in | Wt 143.7 lb

## 2018-04-22 DIAGNOSIS — Z0000 Encounter for general adult medical examination without abnormal findings: Secondary | ICD-10-CM

## 2018-04-22 DIAGNOSIS — E039 Hypothyroidism, unspecified: Secondary | ICD-10-CM

## 2018-04-22 DIAGNOSIS — R7989 Other specified abnormal findings of blood chemistry: Secondary | ICD-10-CM | POA: Diagnosis not present

## 2018-04-22 DIAGNOSIS — R600 Localized edema: Secondary | ICD-10-CM

## 2018-04-22 DIAGNOSIS — Z5181 Encounter for therapeutic drug level monitoring: Secondary | ICD-10-CM | POA: Diagnosis not present

## 2018-04-22 DIAGNOSIS — Z9114 Patient's other noncompliance with medication regimen: Secondary | ICD-10-CM | POA: Diagnosis not present

## 2018-04-22 DIAGNOSIS — E559 Vitamin D deficiency, unspecified: Secondary | ICD-10-CM

## 2018-04-22 DIAGNOSIS — Z9111 Patient's noncompliance with dietary regimen: Secondary | ICD-10-CM

## 2018-04-22 DIAGNOSIS — Z91148 Patient's other noncompliance with medication regimen for other reason: Secondary | ICD-10-CM

## 2018-04-22 MED ORDER — POTASSIUM CHLORIDE ER 10 MEQ PO TBCR: 10.0000 meq | EXTENDED_RELEASE_TABLET | Freq: Every day | ORAL | 6 refills | Status: DC

## 2018-04-22 MED ORDER — FUROSEMIDE 20 MG PO TABS: 20.0000 mg | ORAL_TABLET | Freq: Every day | ORAL | 6 refills | Status: DC

## 2018-04-22 NOTE — Patient Instructions (Addendum)
Toni Allen , Thank you for taking time to come for your Medicare Wellness Visit. I appreciate your ongoing commitment to your health goals. Please review the following plan we discussed and let me know if I can assist you in the future.   Screening recommendations/referrals: Colonoscopy: no longer required Mammogram: no longer required Bone Density: ordered Please call [phone] to schedule.  Recommended yearly ophthalmology/optometry visit for glaucoma screening and checkup Recommended yearly dental visit for hygiene and checkup  Vaccinations: Influenza vaccine: due 07/2018 Pneumococcal vaccine: declined Tdap vaccine: declined Shingles vaccine: shingrix eligible, check with your insurance company for coverage information     Advanced directives: Please bring a copy of your health care power of attorney and living will to the office at your convenience.  Conditions/risks identified: Recommend drinking at least 6-8 glasses of water a day   Next appointment: Follow up in one year for your annual wellness exam    Preventive Care 65 Years and Older, Female Preventive care refers to lifestyle choices and visits with your health care provider that can promote health and wellness. What does preventive care include?  A yearly physical exam. This is also called an annual well check.  Dental exams once or twice a year.  Routine eye exams. Ask your health care provider how often you should have your eyes checked.  Personal lifestyle choices, including:  Daily care of your teeth and gums.  Regular physical activity.  Eating a healthy diet.  Avoiding tobacco and drug use.  Limiting alcohol use.  Practicing safe sex.  Taking low-dose aspirin every day.  Taking vitamin and mineral supplements as recommended by your health care provider. What happens during an annual well check? The services and screenings done by your health care provider during your annual well check will  depend on your age, overall health, lifestyle risk factors, and family history of disease. Counseling  Your health care provider may ask you questions about your:  Alcohol use.  Tobacco use.  Drug use.  Emotional well-being.  Home and relationship well-being.  Sexual activity.  Eating habits.  History of falls.  Memory and ability to understand (cognition).  Work and work Astronomerenvironment.  Reproductive health. Screening  You may have the following tests or measurements:  Height, weight, and BMI.  Blood pressure.  Lipid and cholesterol levels. These may be checked every 5 years, or more frequently if you are over 79 years old.  Skin check.  Lung cancer screening. You may have this screening every year starting at age 79 if you have a 30-pack-year history of smoking and currently smoke or have quit within the past 15 years.  Fecal occult blood test (FOBT) of the stool. You may have this test every year starting at age 79.  Flexible sigmoidoscopy or colonoscopy. You may have a sigmoidoscopy every 5 years or a colonoscopy every 10 years starting at age 79.  Hepatitis C blood test.  Hepatitis B blood test.  Sexually transmitted disease (STD) testing.  Diabetes screening. This is done by checking your blood sugar (glucose) after you have not eaten for a while (fasting). You may have this done every 1-3 years.  Bone density scan. This is done to screen for osteoporosis. You may have this done starting at age 765.  Mammogram. This may be done every 1-2 years. Talk to your health care provider about how often you should have regular mammograms. Talk with your health care provider about your test results, treatment options, and if necessary,  the need for more tests. Vaccines  Your health care provider may recommend certain vaccines, such as:  Influenza vaccine. This is recommended every year.  Tetanus, diphtheria, and acellular pertussis (Tdap, Td) vaccine. You may need a  Td booster every 10 years.  Zoster vaccine. You may need this after age 69.  Pneumococcal 13-valent conjugate (PCV13) vaccine. One dose is recommended after age 56.  Pneumococcal polysaccharide (PPSV23) vaccine. One dose is recommended after age 3. Talk to your health care provider about which screenings and vaccines you need and how often you need them. This information is not intended to replace advice given to you by your health care provider. Make sure you discuss any questions you have with your health care provider. Document Released: 11/17/2015 Document Revised: 07/10/2016 Document Reviewed: 08/22/2015 Elsevier Interactive Patient Education  2017 [employer] Prevention in the Home Falls can cause injuries. They can happen to people of all ages. There are many things you can do to make your home safe and to help prevent falls. What can I do on the outside of my home?  Regularly fix the edges of walkways and driveways and fix any cracks.  Remove anything that might make you trip as you walk through a door, such as a raised step or threshold.  Trim any bushes or trees on the path to your home.  Use bright outdoor lighting.  Clear any walking paths of anything that might make someone trip, such as rocks or tools.  Regularly check to see if handrails are loose or broken. Make sure that both sides of any steps have handrails.  Any raised decks and porches should have guardrails on the edges.  Have any leaves, snow, or ice cleared regularly.  Use sand or salt on walking paths during winter.  Clean up any spills in your garage right away. This includes oil or grease spills. What can I do in the bathroom?  Use night lights.  Install grab bars by the toilet and in the tub and shower. Do not use towel bars as grab bars.  Use non-skid mats or decals in the tub or shower.  If you need to sit down in the shower, use a plastic, non-slip stool.  Keep the floor dry. Clean  up any water that spills on the floor as soon as it happens.  Remove soap buildup in the tub or shower regularly.  Attach bath mats securely with double-sided non-slip rug tape.  Do not have throw rugs and other things on the floor that can make you trip. What can I do in the bedroom?  Use night lights.  Make sure that you have a light by your bed that is easy to reach.  Do not use any sheets or blankets that are too big for your bed. They should not hang down onto the floor.  Have a firm chair that has side arms. You can use this for support while you get dressed.  Do not have throw rugs and other things on the floor that can make you trip. What can I do in the kitchen?  Clean up any spills right away.  Avoid walking on wet floors.  Keep items that you use a lot in easy-to-reach places.  If you need to reach something above you, use a strong step stool that has a grab bar.  Keep electrical cords out of the way.  Do not use floor polish or wax that makes floors slippery. If you must use  wax, use non-skid floor wax.  Do not have throw rugs and other things on the floor that can make you trip. What can I do with my stairs?  Do not leave any items on the stairs.  Make sure that there are handrails on both sides of the stairs and use them. Fix handrails that are broken or loose. Make sure that handrails are as long as the stairways.  Check any carpeting to make sure that it is firmly attached to the stairs. Fix any carpet that is loose or worn.  Avoid having throw rugs at the top or bottom of the stairs. If you do have throw rugs, attach them to the floor with carpet tape.  Make sure that you have a light switch at the top of the stairs and the bottom of the stairs. If you do not have them, ask someone to add them for you. What else can I do to help prevent falls?  Wear shoes that:  Do not have high heels.  Have rubber bottoms.  Are comfortable and fit you well.  Are  closed at the toe. Do not wear sandals.  If you use a stepladder:  Make sure that it is fully opened. Do not climb a closed stepladder.  Make sure that both sides of the stepladder are locked into place.  Ask someone to hold it for you, if possible.  Clearly mark and make sure that you can see:  Any grab bars or handrails.  First and last steps.  Where the edge of each step is.  Use tools that help you move around (mobility aids) if they are needed. These include:  Canes.  Walkers.  Scooters.  Crutches.  Turn on the lights when you go into a dark area. Replace any light bulbs as soon as they burn out.  Set up your furniture so you have a clear path. Avoid moving your furniture around.  If any of your floors are uneven, fix them.  If there are any pets around you, be aware of where they are.  Review your medicines with your doctor. Some medicines can make you feel dizzy. This can increase your chance of falling. Ask your doctor what other  things that you can do to help prevent falls. This information is not intended to replace advice given to you by your health care provider. Make sure you discuss any questions you have with your health care provider. Document Released: 08/17/2009 Document Revised: 03/28/2016 Document Reviewed: 11/25/2014 Elsevier Interactive Patient Education  2017 [employer].

## 2018-04-22 NOTE — Addendum Note (Signed)
Addended by: Marin RobertsHILL, Tavio Biegel A on: 04/22/2018 08:28 AM   Modules accepted: Orders

## 2018-04-22 NOTE — Assessment & Plan Note (Signed)
Encourage compliance with medication consequences not complying primarily nursing home care.

## 2018-04-22 NOTE — Assessment & Plan Note (Signed)
Discussed will check blood work.

## 2018-04-22 NOTE — Assessment & Plan Note (Signed)
Patient remains adamant about not taking supplements.  Will check vitamin D levels

## 2018-04-22 NOTE — Progress Notes (Signed)
BP (!) 142/84   Pulse 70   Ht 5\' 4"  (1.626 m)   Wt 143 lb (64.9 kg)   BMI 24.55 kg/m    Subjective:    Patient ID: Toni Allen, female    DOB: 11-Oct-1941, 77 y.o.   MRN: [id number]  HPI: Toni Allen is a 77 y.o. female  Follow-up vitamin D deficiency hypothyroid.  Patient accompanied by her husband who provides most of the history. In brief discussion with patient patient still remains noncompliant for vitamin D therapy or further evaluation.  Reviewed endocrinology's note from [facility].  Has been trying to get in vitamin D food supplementation sometimes getting ready and sometimes not. Patient remains very confused about food supplements vitamins.  Relevant past medical, surgical, family and social history reviewed and updated as indicated. Interim medical history since our last visit reviewed. Allergies and medications reviewed and updated.  Review of Systems  Constitutional: Negative.   Respiratory: Negative.   Cardiovascular: Negative.     Per HPI unless specifically indicated above     Objective:    BP (!) 142/84   Pulse 70   Ht 5\' 4"  (1.626 m)   Wt 143 lb (64.9 kg)   BMI 24.55 kg/m   Wt Readings from Last 3 Encounters:  04/22/18 143 lb (64.9 kg)  04/22/18 143 lb 11.2 oz (65.2 kg)  09/03/17 137 lb 12.8 oz (62.5 kg)    Physical Exam  Constitutional: She is oriented to person, place, and time. She appears well-developed and well-nourished.  HENT:  Head: Normocephalic and atraumatic.  Eyes: Conjunctivae and EOM are normal.  Neck: Normal range of motion.  Cardiovascular: Normal rate, regular rhythm and normal heart sounds.  Pulmonary/Chest: Effort normal and breath sounds normal.  Musculoskeletal: Normal range of motion.  Neurological: She is alert and oriented to person, place, and time.  Skin: No erythema.  Psychiatric: She has a normal mood and affect. Her behavior is normal. Judgment and thought content normal.    Results for orders placed or  performed in visit on 09/03/17  Comprehensive metabolic panel  Result Value Ref Range   Glucose 86 65 - 99 mg/dL   BUN 11 8 - 27 mg/dL   Creatinine, Ser 4.09 0.57 - 1.00 mg/dL   GFR calc non Af Amer 80 >59 mL/min/1.73   GFR calc Af Amer 93 >59 mL/min/1.73   BUN/Creatinine Ratio 15 12 - 28   Sodium 142 134 - 144 mmol/L   Potassium 4.2 3.5 - 5.2 mmol/L   Chloride 106 96 - 106 mmol/L   CO2 20 20 - 29 mmol/L   Calcium 8.9 8.7 - 10.3 mg/dL   Total Protein 6.6 6.0 - 8.5 g/dL   Albumin 4.3 3.5 - 4.8 g/dL   Globulin, Total 2.3 1.5 - 4.5 g/dL   Albumin/Globulin Ratio 1.9 1.2 - 2.2   Bilirubin Total 0.3 0.0 - 1.2 mg/dL   Alkaline Phosphatase 204 (H) 39 - 117 IU/L   AST 18 0 - 40 IU/L   ALT 15 0 - 32 IU/L      Assessment & Plan:   Problem List Items Addressed This Visit      Endocrine   Hypothyroid    Discussed will check blood work.      Relevant Orders   VITAMIN D 25 Hydroxy (Vit-D Deficiency, Fractures)     Other   Bilateral lower extremity edema   Relevant Medications   furosemide (LASIX) 20 MG tablet   Vitamin D deficiency  Patient remains adamant about not taking supplements.  Will check vitamin D levels      Noncompliance with diet and medication regimen    Encourage compliance with medication consequences not complying primarily nursing home care.          Follow up plan: Return in about 6 months (around 10/22/2018) for Physical Exam.

## 2018-04-22 NOTE — Progress Notes (Signed)
Subjective:   Toni Allen is Allen 77 y.o. female who presents for Medicare Annual (Subsequent) preventive examination.  Review of Systems:   Cardiac Risk Factors include: advanced age (>65men, >43 women)     Objective:     Vitals: BP (!) 142/84 (BP Location: Left Arm, Patient Position: Sitting)   Pulse 70   Temp 98.5 F (36.9 C) (Temporal)   Resp 16   Ht 5\' 4"  (1.626 m)   Wt 143 lb 11.2 oz (65.2 kg)   SpO2 98%   BMI 24.67 kg/m   Body mass index is 24.67 kg/m.  Advanced Directives 04/22/2018 04/28/2017  Does Patient Have Allen Medical Advance Directive? Yes No  Type of Estate agent of [place];Living will -  Copy of Healthcare Power of Attorney in Chart? No - copy requested -    Tobacco Social History   Tobacco Use  Smoking Status Never Smoker  Smokeless Tobacco Never Used     Counseling given: Not Answered   Clinical Intake:  Pre-visit preparation completed: Yes  Pain : No/denies pain     Nutritional Status: BMI of 19-24  Normal Nutritional Risks: None Diabetes: No  How often do you need to have someone help you when you read instructions, pamphlets, or other written materials from your doctor or pharmacy?: 1 - Never What is the last grade level you completed in school?: high school   Interpreter Needed?: No  Information entered by :: Toni Perl,LPN   History reviewed. No pertinent past medical history. Past Surgical History:  Procedure Laterality Date  . ABDOMINAL HYSTERECTOMY     partial   History reviewed. No pertinent family history. Social History   Socioeconomic History  . Marital status: Married    Spouse name: Not on file  . Number of children: Not on file  . Years of education: Not on file  . Highest education level: Not on file  Occupational History  . Not on file  Social Needs  . Financial resource strain: Not hard at all  . Food insecurity:    Worry: Never true    Inability: Never true  .  Transportation needs:    Medical: No    Non-medical: No  Tobacco Use  . Smoking status: Never Smoker  . Smokeless tobacco: Never Used  Substance and Sexual Activity  . Alcohol use: No  . Drug use: No  . Sexual activity: Not on file  Lifestyle  . Physical activity:    Days per week: 0 days    Minutes per session: 0 min  . Stress: Not at all  Relationships  . Social connections:    Talks on phone: More than three times Allen week    Gets together: More than three times Allen week    Attends religious service: Never    Active member of club or organization: No    Attends meetings of clubs or organizations: Never    Relationship status: Married  Other Topics Concern  . Not on file  Social History Narrative  . Not on file    Outpatient Encounter Medications as of 04/22/2018  Medication Sig  . furosemide (LASIX) 20 MG tablet Take 1 tablet (20 mg total) by mouth daily.  . potassium chloride (K-DUR) 10 MEQ tablet Take 1 tablet (10 mEq total) by mouth daily.  . vitamin C (ASCORBIC ACID) 500 MG tablet Take 1,500 mg by mouth daily.  . Vitamin D, Ergocalciferol, (DRISDOL) 50000 units CAPS capsule Take 1 capsule (50,000 Units  total) by mouth every 7 (seven) days. (Patient not taking: Reported on 09/03/2017)   No facility-administered encounter medications on file as of 04/22/2018.     Activities of Daily Living In your present state of health, do you have any difficulty performing the following activities: 04/22/2018 05/12/2017  Hearing? N N  Vision? N N  Difficulty concentrating or making decisions? Y N  Walking or climbing stairs? Y Y  Dressing or bathing? N N  Doing errands, shopping? Malvin JohnsY Y  Comment husband assists  husband takes the patient places  Quarry managerreparing Food and eating ? N -  Using the Toilet? N -  In the past six months, have you accidently leaked urine? Y -  Comment depends  -  Do you have problems with loss of bowel control? N -  Managing your Medications? N -  Managing your  Finances? N -  Housekeeping or managing your Housekeeping? N -  Some recent data might be hidden    Patient Care Team: Toni Sizerrissman, Toni A, MD as PCP - General (Family Medicine)    Assessment:   This is Allen routine wellness examination for Elease Hashimotoatricia.  Exercise Activities and Dietary recommendations Current Exercise Habits: Home exercise routine, Type of exercise: stretching, Time (Minutes): 60, Frequency (Times/Week): 7, Weekly Exercise (Minutes/Week): 420, Intensity: Mild, Exercise limited by: None identified  Goals    . DIET - INCREASE WATER INTAKE     Recommend drinking at least 6-8 glasses of water Allen day        Fall Risk Fall Risk  04/22/2018 12/04/2017 05/12/2017  Falls in the past year? No No No   Is the patient's home free of loose throw rugs in walkways, pet beds, electrical cords, etc?   yes      Grab bars in the bathroom? no      Handrails on the stairs?   no      Adequate lighting?   yes  Timed Get Up and Go performed: Completed in 10 seconds with use of assistive devices- walker, steady gait. No intervention needed at this time.   Depression Screen PHQ 2/9 Scores 04/22/2018 12/04/2017 05/12/2017  PHQ - 2 Score 0 0 0  PHQ- 9 Score - - 1     Cognitive Function- declined 6CIT screening          There is no immunization history on file for this patient.  Qualifies for Shingles Vaccine? Yes,discussed shingrix vaccine  Screening Tests Health Maintenance  Topic Date Due  . DEXA SCAN  03/17/2004  . TETANUS/TDAP  05/12/2018 (Originally 03/17/1958)  . PNA vac Low Risk Adult (1 of 2 - PCV13) 05/12/2018 (Originally 03/17/2004)  . INFLUENZA VACCINE  06/04/2018    Cancer Screenings: Lung: Low Dose CT Chest recommended if Age 72-80 years, 30 pack-year currently smoking OR have quit w/in 15years. Patient does not qualify. Breast:  Up to date on Mammogram? No  Longer required Up to date of Bone Density/Dexa? No, ordered Colorectal: no longer required  Additional Screenings:    Hepatitis C Screening: not indicated     Plan:    I have personally reviewed and addressed the Medicare Annual Wellness questionnaire and have noted the following in the patient's chart:  Allen. Medical and social history B. Use of alcohol, tobacco or illicit drugs  C. Current medications and supplements D. Functional ability and status E.  Nutritional status F.  Physical activity G. Advance directives H. List of other physicians I.  Hospitalizations, surgeries, and ER visits in  previous 12 months J.  Vitals K. Screenings such as hearing and vision if needed, cognitive and depression L. Referrals and appointments   In addition, I have reviewed and discussed with patient certain preventive protocols, quality metrics, and best practice recommendations. Allen written personalized care plan for preventive services as well as general preventive health recommendations were provided to patient.   Signed,  Marin Roberts, LPN Nurse Health Advisor   Nurse Notes:none

## 2018-04-23 LAB — COMPREHENSIVE METABOLIC PANEL
A/G RATIO: 1.9 (ref 1.2–2.2)
ALBUMIN: 4.3 g/dL (ref 3.5–4.8)
ALT: 17 IU/L (ref 0–32)
AST: 17 IU/L (ref 0–40)
Alkaline Phosphatase: 71 IU/L (ref 39–117)
BILIRUBIN TOTAL: 0.4 mg/dL (ref 0.0–1.2)
BUN / CREAT RATIO: 24 (ref 12–28)
BUN: 19 mg/dL (ref 8–27)
CHLORIDE: 103 mmol/L (ref 96–106)
CO2: 24 mmol/L (ref 20–29)
Calcium: 9.5 mg/dL (ref 8.7–10.3)
Creatinine, Ser: 0.8 mg/dL (ref 0.57–1.00)
GFR calc non Af Amer: 70 mL/min/{1.73_m2} (ref >59)
GFR, EST AFRICAN AMERICAN: 81 mL/min/{1.73_m2} (ref >59)
GLOBULIN, TOTAL: 2.3 g/dL (ref 1.5–4.5)
GLUCOSE: 84 mg/dL (ref 65–99)
Potassium: 3.9 mmol/L (ref 3.5–5.2)
SODIUM: 143 mmol/L (ref 134–144)
Total Protein: 6.6 g/dL (ref 6.0–8.5)

## 2018-04-23 LAB — TSH: TSH: 5.37 u[IU]/mL — ABNORMAL HIGH (ref 0.450–4.500)

## 2018-04-23 LAB — VITAMIN D 25 HYDROXY (VIT D DEFICIENCY, FRACTURES): Vit D, 25-Hydroxy: 57.5 ng/mL (ref 30.0–100.0)

## 2018-10-22 ENCOUNTER — Encounter: Payer: Self-pay | Admitting: Family Medicine

## 2018-10-22 ENCOUNTER — Ambulatory Visit: Payer: Medicare Other | Admitting: Family Medicine

## 2018-10-22 VITALS — BP 120/68 | HR 72 | Temp 98.1°F | Ht 58.58 in | Wt 150.4 lb

## 2018-10-22 DIAGNOSIS — Z91148 Patient's other noncompliance with medication regimen for other reason: Secondary | ICD-10-CM

## 2018-10-22 DIAGNOSIS — Z91119 Patient's noncompliance with dietary regimen due to unspecified reason: Secondary | ICD-10-CM

## 2018-10-22 DIAGNOSIS — E559 Vitamin D deficiency, unspecified: Secondary | ICD-10-CM | POA: Diagnosis not present

## 2018-10-22 DIAGNOSIS — I5021 Acute systolic (congestive) heart failure: Secondary | ICD-10-CM | POA: Diagnosis not present

## 2018-10-22 DIAGNOSIS — Z9114 Patient's other noncompliance with medication regimen: Secondary | ICD-10-CM

## 2018-10-22 DIAGNOSIS — Z9111 Patient's noncompliance with dietary regimen: Secondary | ICD-10-CM

## 2018-10-22 DIAGNOSIS — R600 Localized edema: Secondary | ICD-10-CM | POA: Diagnosis not present

## 2018-10-22 DIAGNOSIS — E039 Hypothyroidism, unspecified: Secondary | ICD-10-CM

## 2018-10-22 MED ORDER — FUROSEMIDE 20 MG PO TABS: 20.0000 mg | ORAL_TABLET | Freq: Every day | ORAL | 2 refills | Status: DC

## 2018-10-22 MED ORDER — POTASSIUM CHLORIDE ER 10 MEQ PO TBCR: 10.0000 meq | EXTENDED_RELEASE_TABLET | Freq: Every day | ORAL | 2 refills | Status: DC

## 2018-10-22 NOTE — Assessment & Plan Note (Signed)
The current medical regimen is effective;  continue present plan and medications.  

## 2018-10-22 NOTE — Assessment & Plan Note (Signed)
Doing better with has been working behind the scenes.

## 2018-10-22 NOTE — Progress Notes (Signed)
BP 120/68 (BP Location: Left Arm)   Pulse 72   Temp 98.1 F (36.7 C)   Ht 4' 10.58" (1.488 m)   Wt 150 lb 6 oz (68.2 kg)   SpO2 100%   BMI 30.81 kg/m    Subjective:    Patient ID: Toni Allen, female    DOB: October 04, 1941, 77 y.o.   MRN: [id number]  HPI: Toni Allen is a 77 y.o. female  Follow-up med check Patient with profound low vitamin D levels had been very resistant to taking medications but with husband's working behind the scenes patient has been able to take her vitamin D and last time her levels were back to normal and patient was doing better. Dementia is stable with no acting out behaviors. Heart failure is stable on potassium and Lasix low dose no real edema PND orthopnea changes.  Relevant past medical, surgical, family and social history reviewed and updated as indicated. Interim medical history since our last visit reviewed. Allergies and medications reviewed and updated.  Review of Systems  Constitutional: Negative.   Respiratory: Negative.   Cardiovascular: Negative.   History provided by husband as patient is unable to dementia  Per HPI unless specifically indicated above     Objective:    BP 120/68 (BP Location: Left Arm)   Pulse 72   Temp 98.1 F (36.7 C)   Ht 4' 10.58" (1.488 m)   Wt 150 lb 6 oz (68.2 kg)   SpO2 100%   BMI 30.81 kg/m   Wt Readings from Last 3 Encounters:  10/22/18 150 lb 6 oz (68.2 kg)  04/22/18 143 lb (64.9 kg)  04/22/18 143 lb 11.2 oz (65.2 kg)    Physical Exam Constitutional:      Appearance: She is well-developed.  HENT:     Head: Normocephalic and atraumatic.  Eyes:     Conjunctiva/sclera: Conjunctivae normal.  Neck:     Musculoskeletal: Normal range of motion.  Cardiovascular:     Rate and Rhythm: Normal rate and regular rhythm.     Heart sounds: Normal heart sounds.  Pulmonary:     Effort: Pulmonary effort is normal.     Breath sounds: Normal breath sounds.  Musculoskeletal: Normal range of motion.   Skin:    Findings: No erythema.  Neurological:     Mental Status: She is alert and oriented to person, place, and time.  Psychiatric:        Behavior: Behavior normal.        Thought Content: Thought content normal.        Judgment: Judgment normal.     Results for orders placed or performed in visit on 04/22/18  Comp Met (CMET)  Result Value Ref Range   Glucose 84 65 - 99 mg/dL   BUN 19 8 - 27 mg/dL   Creatinine, Ser 0.80 0.57 - 1.00 mg/dL   GFR calc non Af Amer 70 >59 mL/min/1.73   GFR calc Af Amer 81 >59 mL/min/1.73   BUN/Creatinine Ratio 24 12 - 28   Sodium 143 134 - 144 mmol/L   Potassium 3.9 3.5 - 5.2 mmol/L   Chloride 103 96 - 106 mmol/L   CO2 24 20 - 29 mmol/L   Calcium 9.5 8.7 - 10.3 mg/dL   Total Protein 6.6 6.0 - 8.5 g/dL   Albumin 4.3 3.5 - 4.8 g/dL   Globulin, Total 2.3 1.5 - 4.5 g/dL   Albumin/Globulin Ratio 1.9 1.2 - 2.2   Bilirubin Total 0.4 0.0 -  1.2 mg/dL   Alkaline Phosphatase 71 39 - 117 IU/L   AST 17 0 - 40 IU/L   ALT 17 0 - 32 IU/L  TSH  Result Value Ref Range   TSH 5.370 (H) 0.450 - 4.500 uIU/mL  VITAMIN D 25 Hydroxy (Vit-D Deficiency, Fractures)  Result Value Ref Range   Vit D, 25-Hydroxy 57.5 30.0 - 100.0 ng/mL      Assessment & Plan:   Problem List Items Addressed This Visit      Cardiovascular and Mediastinum   Acute systolic congestive heart failure (HCC)    stable      Relevant Medications   furosemide (LASIX) 20 MG tablet   Other Relevant Orders   Basic metabolic panel     Endocrine   Hypothyroid - Primary    The current medical regimen is effective;  continue present plan and medications.       Relevant Orders   TSH     Other   Bilateral lower extremity edema   Relevant Medications   furosemide (LASIX) 20 MG tablet   Other Relevant Orders   Basic metabolic panel   Vitamin D deficiency    The current medical regimen is effective;  continue present plan and medications.       Relevant Orders   VITAMIN D 25  Hydroxy (Vit-D Deficiency, Fractures)   Noncompliance with diet and medication regimen    Doing better with has been working behind the scenes.           Follow up plan: Return in about 6 months (around 04/23/2019) for Physical Exam.

## 2018-10-22 NOTE — Assessment & Plan Note (Signed)
stable °

## 2018-10-23 ENCOUNTER — Encounter: Payer: Self-pay | Admitting: Family Medicine

## 2018-10-23 LAB — BASIC METABOLIC PANEL
BUN / CREAT RATIO: 16 (ref 12–28)
BUN: 14 mg/dL (ref 8–27)
CALCIUM: 9.7 mg/dL (ref 8.7–10.3)
CHLORIDE: 102 mmol/L (ref 96–106)
CO2: 22 mmol/L (ref 20–29)
Creatinine, Ser: 0.9 mg/dL (ref 0.57–1.00)
GFR calc Af Amer: 70 mL/min/{1.73_m2} (ref >59)
GFR calc non Af Amer: 61 mL/min/{1.73_m2} (ref >59)
GLUCOSE: 83 mg/dL (ref 65–99)
POTASSIUM: 3.9 mmol/L (ref 3.5–5.2)
Sodium: 137 mmol/L (ref 134–144)

## 2018-10-23 LAB — VITAMIN D 25 HYDROXY (VIT D DEFICIENCY, FRACTURES): Vit D, 25-Hydroxy: 56.8 ng/mL (ref 30.0–100.0)

## 2018-10-23 LAB — TSH: TSH: 3.71 u[IU]/mL (ref 0.450–4.500)

## 2019-03-24 DIAGNOSIS — N179 Acute kidney failure, unspecified: Secondary | ICD-10-CM | POA: Insufficient documentation

## 2019-03-24 DIAGNOSIS — R410 Disorientation, unspecified: Secondary | ICD-10-CM | POA: Insufficient documentation

## 2019-03-24 DIAGNOSIS — R778 Other specified abnormalities of plasma proteins: Secondary | ICD-10-CM | POA: Insufficient documentation

## 2019-03-25 DIAGNOSIS — I82409 Acute embolism and thrombosis of unspecified deep veins of unspecified lower extremity: Secondary | ICD-10-CM | POA: Insufficient documentation

## 2019-03-26 MED ORDER — BACITRACIN-POLYMYXIN B 500-10000 UNIT/GM EX OINT
TOPICAL_OINTMENT | CUTANEOUS | Status: DC
Start: 2019-03-26 — End: 2019-03-26

## 2019-03-26 MED ORDER — VITAMIN B-12 1000 MCG PO TABS
1000.00 | ORAL_TABLET | ORAL | Status: DC
Start: 2019-03-27 — End: 2019-03-26

## 2019-03-26 MED ORDER — MEDI-TUSSIN DM DOUBLE STRENGTH 30-200 MG/5ML PO LIQD
1000.00 | ORAL | Status: DC
Start: ? — End: 2019-03-26

## 2019-03-26 MED ORDER — SENNOSIDES 8.6 MG PO TABS
2.00 | ORAL_TABLET | ORAL | Status: DC
Start: ? — End: 2019-03-26

## 2019-03-26 MED ORDER — POLYETHYLENE GLYCOL 3350 17 G PO PACK
17.00 | PACK | ORAL | Status: DC
Start: 2019-03-26 — End: 2019-03-26

## 2019-03-26 MED ORDER — Medication
Status: DC
Start: ? — End: 2019-03-26

## 2019-04-05 ENCOUNTER — Telehealth: Payer: Self-pay | Admitting: Family Medicine

## 2019-04-05 NOTE — Telephone Encounter (Signed)
Called pt about tomorrow's appt no answer, no voicemail

## 2019-04-06 ENCOUNTER — Ambulatory Visit (INDEPENDENT_AMBULATORY_CARE_PROVIDER_SITE_OTHER): Payer: Medicare Other | Admitting: Family Medicine

## 2019-04-06 ENCOUNTER — Other Ambulatory Visit: Payer: Self-pay

## 2019-04-06 ENCOUNTER — Encounter: Payer: Self-pay | Admitting: Family Medicine

## 2019-04-06 DIAGNOSIS — Z9111 Patient's noncompliance with dietary regimen: Secondary | ICD-10-CM | POA: Diagnosis not present

## 2019-04-06 DIAGNOSIS — Z91119 Patient's noncompliance with dietary regimen due to unspecified reason: Secondary | ICD-10-CM

## 2019-04-06 DIAGNOSIS — I5021 Acute systolic (congestive) heart failure: Secondary | ICD-10-CM

## 2019-04-06 DIAGNOSIS — Z9114 Patient's other noncompliance with medication regimen: Secondary | ICD-10-CM

## 2019-04-06 NOTE — Assessment & Plan Note (Signed)
The current medical regimen is effective;  continue present plan and medications.  

## 2019-04-06 NOTE — Assessment & Plan Note (Signed)
Reviewed family is doing better with food and water bringing in food and water and assessing daily

## 2019-04-06 NOTE — Progress Notes (Signed)
There were no vitals taken for this visit.   Subjective:    Patient ID: Toni Allen, female    DOB: August 11, 1941, 78 y.o.   MRN: [id number]  HPI: Toni Allen is a 78 y.o. female  No chief complaint on file.  Telemedicine using audio/video telecommunications for a synchronous communication visit. Today's visit due to COVID-19 isolation precautions I connected with and verified that I am speaking with the correct person using two identifiers.   I discussed the limitations, risks, security and privacy concerns of performing an evaluation and management service by telecommunication and the availability of in person appointments. I also discussed with the patient that there may be a patient responsible charge related to this service. The patient expressed understanding and agreed to proceed. The patient's location is home. I am at home.  Transition of Care Hospital Follow up.   Hospital/Facility:[facility] D/C Physician: Lodema Hong D/C Date: 03-26-19  Records Requested: na Records Received: na Records Reviewed: today  Diagnoses on Discharge: dementia  Date of interactive Contact within 48 hours of discharge: 03-29-19 Contact was through: phone  Date of 7 day or 14 day face-to-face visit:    04-06-19  Outpatient Encounter Medications as of 04/06/2019  Medication Sig  . furosemide (LASIX) 20 MG tablet Take 1 tablet (20 mg total) by mouth daily.  . potassium chloride (K-DUR) 10 MEQ tablet Take 1 tablet (10 mEq total) by mouth daily.  . vitamin C (ASCORBIC ACID) 500 MG tablet Take 1,500 mg by mouth daily.   No facility-administered encounter medications on file as of 04/06/2019.     Diagnostic Tests Reviewed/Disposition: done  Consults:na  Discharge Instructionsreviewed  Disease/illness Education:doen  Home Health/Community Services Discussions/Referrals:na  Establishment or re-establishment of referral orders for community resources:na  Discussion with other health care  providers:na  Assessment and Support of treatment regimen adherence:does  Appointments Coordinated with: na  Education for self-management, independent living, and ADLs: na  Relevant past medical, surgical, family and social history reviewed and updated as indicated. Interim medical history since our last visit reviewed. Allergies and medications reviewed and updated.  Review of Systems  Unable to perform ROS: Dementia    Per HPI unless specifically indicated above     Objective:    There were no vitals taken for this visit.  Wt Readings from Last 3 Encounters:  10/22/18 150 lb 6 oz (68.2 kg)  04/22/18 143 lb (64.9 kg)  04/22/18 143 lb 11.2 oz (65.2 kg)    Physical Exam  Results for orders placed or performed in visit on 10/22/18  Basic metabolic panel  Result Value Ref Range   Glucose 83 65 - 99 mg/dL   BUN 14 8 - 27 mg/dL   Creatinine, Ser 6.50 0.57 - 1.00 mg/dL   GFR calc non Af Amer 61 >59 mL/min/1.73   GFR calc Af Amer 70 >59 mL/min/1.73   BUN/Creatinine Ratio 16 12 - 28   Sodium 137 134 - 144 mmol/L   Potassium 3.9 3.5 - 5.2 mmol/L   Chloride 102 96 - 106 mmol/L   CO2 22 20 - 29 mmol/L   Calcium 9.7 8.7 - 10.3 mg/dL  TSH  Result Value Ref Range   TSH 3.710 0.450 - 4.500 uIU/mL  VITAMIN D 25 Hydroxy (Vit-D Deficiency, Fractures)  Result Value Ref Range   Vit D, 25-Hydroxy 56.8 30.0 - 100.0 ng/mL      Assessment & Plan:   Problem List Items Addressed This Visit      Cardiovascular  and Mediastinum   Acute systolic congestive heart failure (HCC)    The current medical regimen is effective;  continue present plan and medications.         Other   Noncompliance with diet and medication regimen    Reviewed family is doing better with food and water bringing in food and water and assessing daily          Follow up plan: Return if symptoms worsen or fail to improve, for As scheduled.

## 2019-05-04 ENCOUNTER — Other Ambulatory Visit: Payer: Self-pay

## 2019-05-04 ENCOUNTER — Ambulatory Visit: Payer: Medicare Other | Admitting: Family Medicine

## 2019-07-02 ENCOUNTER — Telehealth: Payer: Self-pay | Admitting: Family Medicine

## 2019-07-02 MED ORDER — APIXABAN 5 MG PO TABS: 5.0000 mg | ORAL_TABLET | Freq: Two times a day (BID) | ORAL | 0 refills | Status: DC

## 2019-07-02 NOTE — Telephone Encounter (Signed)
Please see message below

## 2019-07-02 NOTE — Telephone Encounter (Signed)
Copied from [place] [id number]. Topic: General - Other >> Jul 02, 2019  9:21 AM Leward Quan A wrote: Reason for CRM: [facility] called to say that patient is requesting a refill on a medication Eliquis 5 mg twice daily she was on this medication while in the hospital and now she is out. Asking if Dr want her to continue please send Rx to the drug store ASAP. Please advise

## 2019-07-02 NOTE — Telephone Encounter (Signed)
Son is calling back - he says that she was started on eliquis after a fall in May.  She was admitted and had a dvt.  They plan for the eliquis was to take it for 60 days and be done with it.  For some reason, the pharm or [facility] dispensed 56 pills, and pt iIs short 4 pills.  Pt took her last pill today.  Son Is asking for this to be ok'd and sent to [facility]. (just 4 pills) Attempted to call office for hosp folow up, no answer.  cb for son is [phone]

## 2019-07-02 NOTE — Telephone Encounter (Signed)
I don't see patient being admitted- I dont' have this medicine on her medication list. Please find out when she was started on it, who started it, and when she got her last refill. WIll need a hospital follow up ASAP

## 2019-10-04 IMAGING — CR DG HIP (WITH OR WITHOUT PELVIS) 2-3V*R*
1 series · 1 of 1 positions shown · non-contrast
Comparison: None.

CLINICAL DATA: Bilateral lower extremity edema.

EXAM:
DG HIP (WITH OR WITHOUT PELVIS) 2-3V RIGHT

[hip ap]
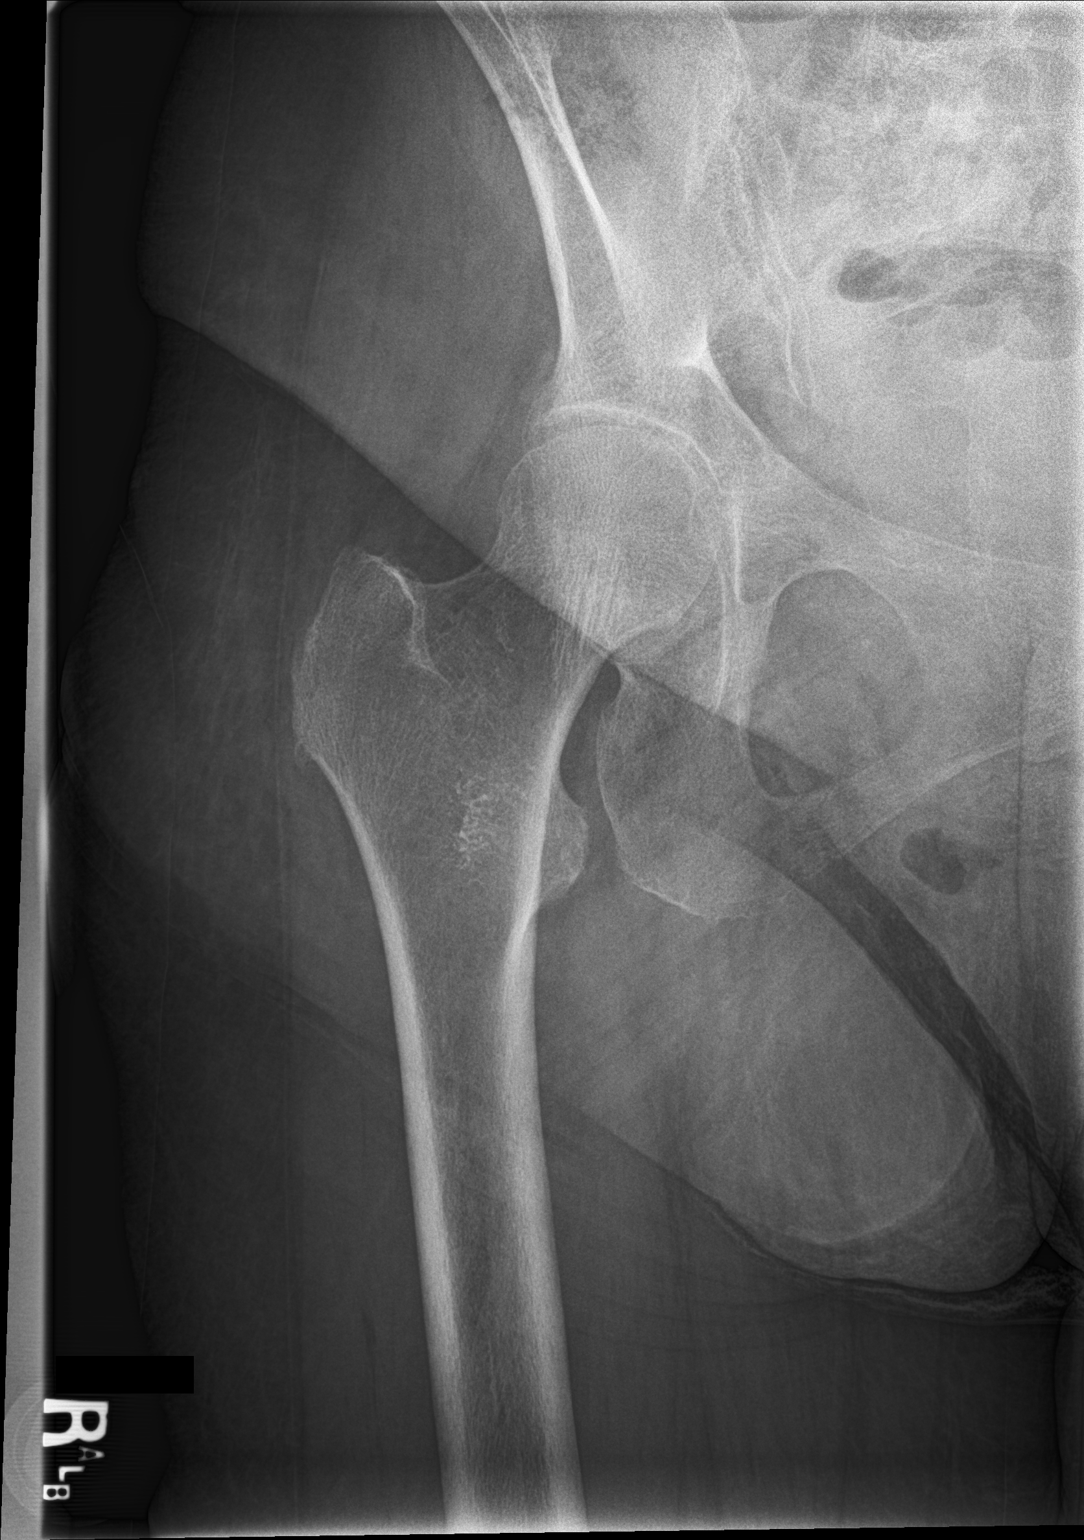

[1 of 1 positions shown; findings below may reference images not displayed]

FINDINGS: There is no evidence of hip fracture or dislocation. There is no
evidence of arthropathy or other focal bone abnormality.
IMPRESSION: Normal right hip.

## 2020-08-17 ENCOUNTER — Ambulatory Visit: Payer: Self-pay | Admitting: Unknown Physician Specialty

## 2020-08-17 ENCOUNTER — Telehealth: Payer: Medicare Other | Admitting: Unknown Physician Specialty

## 2020-08-17 ENCOUNTER — Ambulatory Visit (INDEPENDENT_AMBULATORY_CARE_PROVIDER_SITE_OTHER): Payer: Medicare Other | Admitting: Unknown Physician Specialty

## 2020-08-17 ENCOUNTER — Encounter: Payer: Self-pay | Admitting: Unknown Physician Specialty

## 2020-08-17 DIAGNOSIS — F0390 Unspecified dementia without behavioral disturbance: Secondary | ICD-10-CM

## 2020-08-17 NOTE — Progress Notes (Signed)
   There were no vitals taken for this visit.   Subjective:    Patient ID: Toni Allen, female    DOB: 1941-03-23, 79 y.o.   MRN: [id number]  HPI: Toni Allen is a 79 y.o. female  Chief Complaint  Patient presents with  . Memory Loss   Pt's son called, and in team with other son and sister.  They are all medical professionals.  They note she has trouble with memory, attention, comprehension, reading and writing.  Most recently, they noted incontinence that she doesn't notice or manage.  Reports changes came on slowly and progressive.    Son has a durable POA but not a medical POA.  He is wondering about legal guardianship.  They would like to move her mom to their sister's Toni Allen) house.    While on the phone, she is frustrated that she can't go anywhere.  She can remember to ambulate with a walker  Relevant past medical, surgical, family and social history reviewed and updated as indicated. Interim medical history since our last visit reviewed. Allergies and medications reviewed and updated.  Review of Systems  Per HPI unless specifically indicated above     Objective:    There were no vitals taken for this visit.  Wt Readings from Last 3 Encounters:  10/22/18 150 lb 6 oz (68.2 kg)  04/22/18 143 lb (64.9 kg)  04/22/18 143 lb 11.2 oz (65.2 kg)     Report from family 130/82, Pulse ox 96%, Heart RRR.  Breath sounds clear.  Oriented to person (able to name husband and son), but not to place or time.     Results for orders placed or performed in visit on 10/22/18  Basic metabolic panel  Result Value Ref Range   Glucose 83 65 - 99 mg/dL   BUN 14 8 - 27 mg/dL   Creatinine, Ser 6.38 0.57 - 1.00 mg/dL   GFR calc non Af Amer 61 >59 mL/min/1.73   GFR calc Af Amer 70 >59 mL/min/1.73   BUN/Creatinine Ratio 16 12 - 28   Sodium 137 134 - 144 mmol/L   Potassium 3.9 3.5 - 5.2 mmol/L   Chloride 102 96 - 106 mmol/L   CO2 22 20 - 29 mmol/L   Calcium 9.7 8.7 - 10.3 mg/dL  TSH   Result Value Ref Range   TSH 3.710 0.450 - 4.500 uIU/mL  VITAMIN D 25 Hydroxy (Vit-D Deficiency, Fractures)  Result Value Ref Range   Vit D, 25-Hydroxy 56.8 30.0 - 100.0 ng/mL      Assessment & Plan:   Problem List Items Addressed This Visit      Unprioritized   Dementia (HCC)    They will need a letter stating that she is unable to manage her own affairs.  I will put in an appt for social work to help make plans.  Letter is in the chart         I support guardianship.  Pt is unable to manage her own affairs.    30 minutes spent in discussion with pt and family  Follow up plan:  Letter is in the chart.

## 2020-08-17 NOTE — Assessment & Plan Note (Signed)
They will need a letter stating that she is unable to manage her own affairs.  I will put in an appt for social work to help make plans.  Letter is in the chart

## 2020-08-24 ENCOUNTER — Telehealth: Payer: Self-pay

## 2020-08-24 NOTE — Chronic Care Management (AMB) (Signed)
  Chronic Care Management   Note  08/24/2020 Name: Toni Allen MRN: [id number] DOB: [birth year]  Toni Allen is a 79 y.o. year old female who is a primary care patient of Crissman, Jeannette How, MD. I reached out to Levada Schilling by phone today in response to a referral sent by Ms. Ferman Hamming PCP, Kathrine Haddock, NP      Ms. Morlock was given information about Chronic Care Management services today including:  1. CCM service includes personalized support from designated clinical staff supervised by her physician, including individualized plan of care and coordination with other care providers 2. 24/7 contact phone numbers for assistance for urgent and routine care needs. 3. Service will only be billed when office clinical staff spend 20 minutes or more in a month to coordinate care. 4. Only one practitioner may furnish and bill the service in a calendar month. 5. The patient may stop CCM services at any time (effective at the end of the month) by phone call to the office staff. 6. The patient will be responsible for cost sharing (co-pay) of up to 20% of the service fee (after annual deductible is met).  Patient's son did not agree to enrollment in care management services and does not wish to consider at this time.  Follow up plan: The patient has been provided with contact information for the care management team and has been advised to call with any health related questions or concerns.   Noreene Larsson, [place], [place], [place] [address] Direct Dial: [phone] [email or web address]: [email or web address]

## 2021-01-21 DIAGNOSIS — W19XXXA Unspecified fall, initial encounter: Secondary | ICD-10-CM | POA: Insufficient documentation

## 2021-02-28 ENCOUNTER — Other Ambulatory Visit: Payer: Self-pay

## 2021-02-28 ENCOUNTER — Ambulatory Visit (INDEPENDENT_AMBULATORY_CARE_PROVIDER_SITE_OTHER): Payer: Medicare Other | Admitting: Internal Medicine

## 2021-02-28 ENCOUNTER — Encounter: Payer: Self-pay | Admitting: Internal Medicine

## 2021-02-28 DIAGNOSIS — F0390 Unspecified dementia without behavioral disturbance: Secondary | ICD-10-CM | POA: Diagnosis not present

## 2021-02-28 DIAGNOSIS — F03C0 Unspecified dementia, severe, without behavioral disturbance, psychotic disturbance, mood disturbance, and anxiety: Secondary | ICD-10-CM

## 2021-02-28 NOTE — Progress Notes (Addendum)
There were no vitals taken for this visit.   Subjective:    Patient ID: Toni Allen, female    DOB: April 27, 1941, 80 y.o.   MRN: [id number]  HPI: Toni Allen is a 80 y.o. female  I connected with Toni Allen on 02/28/21 by a video enabled telemedicine application and verified that I am speaking with the correct person using two identifiers.  I discussed the limitations of evaluation and management by telemedicine. The patient expressed understanding and agreed to proceed. "I discussed the limitations of evaluation and management by telemedicine and the availability of in person appointments. The patient expressed understanding and agreed to proceed"     This visit was completed via telephone due to the restrictions of the COVID-19 pandemic. All issues as above were discussed and addressed. The patient verbally consented to this visit. Location of the patient: home Location of the provider: work Those involved with this call:  Provider: Loura Pardon, MD CMA: Tristan Schroeder, CMA Front Desk/Registration: Beverely Pace  Time spent on call: 25 minutes on the phone discussing health concerns. 20 minutes total spent in review of patient's record and preparation of their chart.    In du[place] home x 2 weeks, was at [facility] [place] - x 2 weeks for a fall. Has seevre dementia , has had visual and auditory hallucinations some are at night ones a character from a book she was last reading. She introduced daughter to one of the characters, she talks to them. She is bed ridden. She was upset as she was in a bed and there were people in the room she didn't want to be changed in front of "them" per her daughter.  She woke up one morning terrified. Per daughter, pt has been rambling from lat summer / fall and her words/ sentences dont make sense. She had a recent CT Head which was done in the ER and shows severe global atrophy of her brain.  Visual hallucinations started x 2 weeks  ago. She wasn't hallucinating In her home before. She had a minor UTI -  She has self isolated x 30 yrs, likes to live in her room and reads a book , sat in a chair and looked out the window. Last year stopped reading as well.   Of note from d/c papers pt had AKI sec to poor po intake cr was back to baseline on d/c from hosp , had a rt quaratis femoris tendon tear/ bil gluteus minimus tear and high grade tear of the r quadratus femoris tendon with some peritrochanteric bursitis.    Chief Complaint  Patient presents with  . Hallucinations    Speaks to hallucinations, does get agitated with them.  . mood swings    Relevant past medical, surgical, family and social history reviewed and updated as indicated. Interim medical history since our last visit reviewed. Allergies and medications reviewed and updated.  Review of Systems  Per HPI unless specifically indicated above     Objective:    There were no vitals taken for this visit.  Wt Readings from Last 3 Encounters:  10/22/18 150 lb 6 oz (68.2 kg)  04/22/18 143 lb (64.9 kg)  04/22/18 143 lb 11.2 oz (65.2 kg)    Physical Exam Vitals reviewed: couldnt be performed sec to virtual visit      Results for orders placed or performed in visit on 10/22/18  Basic metabolic panel  Result Value Ref Range   Glucose 83 65 - 99 mg/dL  BUN 14 8 - 27 mg/dL   Creatinine, Ser 2.95 0.57 - 1.00 mg/dL   GFR calc non Af Amer 61 >59 mL/min/1.73   GFR calc Af Amer 70 >59 mL/min/1.73   BUN/Creatinine Ratio 16 12 - 28   Sodium 137 134 - 144 mmol/L   Potassium 3.9 3.5 - 5.2 mmol/L   Chloride 102 96 - 106 mmol/L   CO2 22 20 - 29 mmol/L   Calcium 9.7 8.7 - 10.3 mg/dL  TSH  Result Value Ref Range   TSH 3.710 0.450 - 4.500 uIU/mL  VITAMIN D 25 Hydroxy (Vit-D Deficiency, Fractures)  Result Value Ref Range   Vit D, 25-Hydroxy 56.8 30.0 - 100.0 ng/mL        Current Outpatient Medications:  .  acetaminophen (TYLENOL) 500 MG tablet, Take by  mouth., Disp: , Rfl:  .  melatonin 3 MG TABS tablet, Take by mouth., Disp: , Rfl:  .  senna (SENOKOT) 8.6 MG tablet, Take 2 tablets by mouth at bedtime., Disp: , Rfl:   Result Date: 01/23/2021 EXAM: MRI LOWER EXTREMITY JOINT RIGHT WO CONTRAST DATE: 01/23/2021 12:59 PM ACCESSION: [id number] UN DICTATED: 01/23/2021 1:18 PM INTERPRETATION LOCATION: [facility] CLINICAL INDICATION: 80 years old Female with recent fall, r/o fracture COMPARISON: Right hip radiographs 01/20/2021 TECHNIQUE: MRI of the pelvis was performed using a local coil. Multisequence, multiplanar images were obtained without contrast. Additional small field-of-view images of the right hip were obtained. FINDINGS: Evaluation of the bone marrow reveals normal marrow signal throughout the visualized bones of the pelvis and proximal femurs. Multilevel disc desiccation of the visualized lower lumbar spine. A high grade tear of the right quadratus femoris tendon is noted near its insertion adjacent to the iliopsoas with adjacent small volume fluid. Mild edema in the adjacent right obturator externus. The musculature of the pelvis is otherwise normal and symmetric. Tiny partial-thickness tear at the insertion of the gluteus minimus tendons bilaterally. The gluteus medius tendons appear intact. Small volume peritrochanteric fluid. Tendon origins are normal. The sacroiliac joints are normal. Diffuse chondral irregularity and partial-thickness chondral loss throughout the right hip with subchondral reactive change along the superolateral aspect of the acetabulum. The left hip is approximated. Degenerative tearing throughout the right anterior and posterior labrum. The sciatic nerves are normal. Vasculature of the pelvis is normal. Intrapelvic contents are normal.   1. High grade tear of the right quadratus femoris tendon. Small volume of fluid adjacent to the lesser trochanter, likely reactive. 2. Tiny partial-thickness insertional tears of the gluteus minimus  bilaterally with associated small volume peritrochanteric bursitis. 3. 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 4. No fracture.      Assessment & Plan:  1. Severe dementia: with visual hallucinations x 2 weeks, along with auditory hallucinations as well. ? Sec to UTI  Pt advised pts daughter that pt Needs to go to the ER - ? Visual hallucinations are sec to an infection.   Per daughter, she personally feels like she is trapped and her mom doesn't want to be seen however understands that she does need an evaluation. Daughter is a TEFL teacher and son in law is an Nutritional therapist and have the pt living with them at the moment. She as with her son who I spoke with as well to obtain numbers of daughter as the virtual visit got disconnected.  She agrees to take her to the hospital for further evaluation and rx of this acute medical issue.  Will refer pt to NEUROLOGY ASAP for further  eval and rx of severe dementia.  Daughter tells me that she does not want to be seen in the office to order acute labs.  And they will try and take her to the hospital for an evaluation today.    Follow up plan: No follow-ups on file.

## 2021-05-28 DIAGNOSIS — R531 Weakness: Secondary | ICD-10-CM | POA: Diagnosis not present

## 2021-06-26 DIAGNOSIS — W19XXXD Unspecified fall, subsequent encounter: Secondary | ICD-10-CM | POA: Diagnosis not present

## 2021-06-26 DIAGNOSIS — K5901 Slow transit constipation: Secondary | ICD-10-CM | POA: Diagnosis not present

## 2021-06-28 DIAGNOSIS — R531 Weakness: Secondary | ICD-10-CM | POA: Diagnosis not present

## 2021-07-29 DIAGNOSIS — R531 Weakness: Secondary | ICD-10-CM | POA: Diagnosis not present

## 2021-08-22 DIAGNOSIS — W19XXXS Unspecified fall, sequela: Secondary | ICD-10-CM | POA: Diagnosis not present

## 2021-08-22 DIAGNOSIS — R531 Weakness: Secondary | ICD-10-CM | POA: Diagnosis not present

## 2021-08-28 DIAGNOSIS — R531 Weakness: Secondary | ICD-10-CM | POA: Diagnosis not present

## 2021-09-22 DIAGNOSIS — R531 Weakness: Secondary | ICD-10-CM | POA: Diagnosis not present

## 2021-09-28 DIAGNOSIS — R531 Weakness: Secondary | ICD-10-CM | POA: Diagnosis not present

## 2021-10-22 DIAGNOSIS — R531 Weakness: Secondary | ICD-10-CM | POA: Diagnosis not present

## 2021-10-28 DIAGNOSIS — R531 Weakness: Secondary | ICD-10-CM | POA: Diagnosis not present

## 2021-11-22 DIAGNOSIS — R531 Weakness: Secondary | ICD-10-CM | POA: Diagnosis not present

## 2021-12-23 DIAGNOSIS — R531 Weakness: Secondary | ICD-10-CM | POA: Diagnosis not present

## 2022-02-04 DIAGNOSIS — F03C2 Unspecified dementia, severe, with psychotic disturbance: Secondary | ICD-10-CM | POA: Diagnosis not present

## 2022-03-13 DIAGNOSIS — F03C2 Unspecified dementia, severe, with psychotic disturbance: Secondary | ICD-10-CM | POA: Diagnosis not present

## 2022-03-13 DIAGNOSIS — K5901 Slow transit constipation: Secondary | ICD-10-CM | POA: Diagnosis not present

## 2022-06-19 ENCOUNTER — Telehealth: Payer: Self-pay

## 2022-06-19 NOTE — Telephone Encounter (Signed)
Called patient to discuss appt for DM follow up no answer, unable to leave a voicemail for patient to return my call.    Ok for nurse triage to give results if patient calls back.

## 2024-01-03 DEATH — deceased
